# Patient Record
Sex: Female | Born: 1980 | Race: Black or African American | Hispanic: No | Marital: Single | Smoking: Current every day smoker
Health system: Southern US, Community
[De-identification: ages and names within clinical notes are randomized; demographics above are authoritative.]

## PROBLEM LIST (undated history)

## (undated) ENCOUNTER — Emergency Department (HOSPITAL_COMMUNITY): Admission: EM | Source: Home / Self Care

## (undated) ENCOUNTER — Emergency Department (HOSPITAL_COMMUNITY): Admission: EM | Payer: Self-pay | Source: Home / Self Care

## (undated) DIAGNOSIS — D649 Anemia, unspecified: Secondary | ICD-10-CM

## (undated) DIAGNOSIS — N939 Abnormal uterine and vaginal bleeding, unspecified: Secondary | ICD-10-CM

## (undated) DIAGNOSIS — Z789 Other specified health status: Secondary | ICD-10-CM

## (undated) DIAGNOSIS — O24419 Gestational diabetes mellitus in pregnancy, unspecified control: Secondary | ICD-10-CM

## (undated) HISTORY — PX: OTHER SURGICAL HISTORY: SHX169

## (undated) HISTORY — PX: BREAST SURGERY: SHX581

---
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
## 2002-06-11 ENCOUNTER — Emergency Department (HOSPITAL_COMMUNITY): Admission: EM | Admit: 2002-06-11 | Discharge: 2002-06-11 | Payer: Self-pay | Admitting: Emergency Medicine

## 2002-09-18 ENCOUNTER — Emergency Department (HOSPITAL_COMMUNITY): Admission: EM | Admit: 2002-09-18 | Discharge: 2002-09-18 | Payer: Self-pay | Admitting: Emergency Medicine

## 2002-10-20 ENCOUNTER — Emergency Department (HOSPITAL_COMMUNITY): Admission: EM | Admit: 2002-10-20 | Discharge: 2002-10-20 | Payer: Self-pay | Admitting: Emergency Medicine

## 2002-10-26 ENCOUNTER — Emergency Department (HOSPITAL_COMMUNITY): Admission: EM | Admit: 2002-10-26 | Discharge: 2002-10-26 | Payer: Self-pay | Admitting: Emergency Medicine

## 2002-11-20 ENCOUNTER — Emergency Department (HOSPITAL_COMMUNITY): Admission: EM | Admit: 2002-11-20 | Discharge: 2002-11-20 | Payer: Self-pay | Admitting: Emergency Medicine

## 2002-12-18 ENCOUNTER — Emergency Department (HOSPITAL_COMMUNITY): Admission: EM | Admit: 2002-12-18 | Discharge: 2002-12-18 | Payer: Self-pay | Admitting: *Deleted

## 2003-03-22 ENCOUNTER — Inpatient Hospital Stay (HOSPITAL_COMMUNITY): Admission: AD | Admit: 2003-03-22 | Discharge: 2003-03-22 | Payer: Self-pay | Admitting: Obstetrics and Gynecology

## 2003-05-10 ENCOUNTER — Inpatient Hospital Stay (HOSPITAL_COMMUNITY): Admission: AD | Admit: 2003-05-10 | Discharge: 2003-05-10 | Payer: Self-pay | Admitting: Obstetrics

## 2003-05-11 ENCOUNTER — Inpatient Hospital Stay (HOSPITAL_COMMUNITY): Admission: AD | Admit: 2003-05-11 | Discharge: 2003-05-11 | Payer: Self-pay | Admitting: Obstetrics and Gynecology

## 2003-06-29 ENCOUNTER — Inpatient Hospital Stay (HOSPITAL_COMMUNITY): Admission: AD | Admit: 2003-06-29 | Discharge: 2003-06-29 | Payer: Self-pay | Admitting: Obstetrics and Gynecology

## 2003-10-29 ENCOUNTER — Inpatient Hospital Stay (HOSPITAL_COMMUNITY): Admission: AD | Admit: 2003-10-29 | Discharge: 2003-10-29 | Payer: Self-pay | Admitting: Obstetrics and Gynecology

## 2003-12-21 ENCOUNTER — Inpatient Hospital Stay (HOSPITAL_COMMUNITY): Admission: AD | Admit: 2003-12-21 | Discharge: 2003-12-21 | Payer: Self-pay | Admitting: *Deleted

## 2004-01-11 ENCOUNTER — Inpatient Hospital Stay (HOSPITAL_COMMUNITY): Admission: AD | Admit: 2004-01-11 | Discharge: 2004-01-13 | Payer: Self-pay | Admitting: Obstetrics and Gynecology

## 2005-07-02 ENCOUNTER — Encounter (HOSPITAL_BASED_OUTPATIENT_CLINIC_OR_DEPARTMENT_OTHER): Admission: RE | Admit: 2005-07-02 | Discharge: 2005-09-30 | Payer: Self-pay | Admitting: Surgery

## 2005-07-14 ENCOUNTER — Emergency Department (HOSPITAL_COMMUNITY): Admission: EM | Admit: 2005-07-14 | Discharge: 2005-07-14 | Payer: Self-pay | Admitting: Emergency Medicine

## 2005-07-18 ENCOUNTER — Emergency Department (HOSPITAL_COMMUNITY): Admission: EM | Admit: 2005-07-18 | Discharge: 2005-07-18 | Payer: Self-pay | Admitting: Family Medicine

## 2005-07-19 ENCOUNTER — Ambulatory Visit: Payer: Self-pay | Admitting: Gastroenterology

## 2005-07-20 ENCOUNTER — Ambulatory Visit (HOSPITAL_COMMUNITY): Admission: RE | Admit: 2005-07-20 | Discharge: 2005-07-20 | Payer: Self-pay | Admitting: Gastroenterology

## 2005-07-20 ENCOUNTER — Ambulatory Visit: Payer: Self-pay | Admitting: Gastroenterology

## 2005-10-08 ENCOUNTER — Emergency Department (HOSPITAL_COMMUNITY): Admission: EM | Admit: 2005-10-08 | Discharge: 2005-10-08 | Payer: Self-pay | Admitting: Family Medicine

## 2005-10-15 ENCOUNTER — Emergency Department (HOSPITAL_COMMUNITY): Admission: EM | Admit: 2005-10-15 | Discharge: 2005-10-15 | Payer: Self-pay | Admitting: Family Medicine

## 2006-04-15 ENCOUNTER — Emergency Department (HOSPITAL_COMMUNITY): Admission: EM | Admit: 2006-04-15 | Discharge: 2006-04-15 | Payer: Self-pay | Admitting: Family Medicine

## 2007-05-07 ENCOUNTER — Emergency Department (HOSPITAL_COMMUNITY): Admission: EM | Admit: 2007-05-07 | Discharge: 2007-05-07 | Payer: Self-pay | Admitting: *Deleted

## 2007-06-18 ENCOUNTER — Emergency Department (HOSPITAL_COMMUNITY): Admission: EM | Admit: 2007-06-18 | Discharge: 2007-06-18 | Payer: Self-pay | Admitting: Emergency Medicine

## 2007-11-13 ENCOUNTER — Emergency Department (HOSPITAL_COMMUNITY): Admission: EM | Admit: 2007-11-13 | Discharge: 2007-11-13 | Payer: Self-pay | Admitting: Family Medicine

## 2008-01-22 ENCOUNTER — Emergency Department (HOSPITAL_COMMUNITY): Admission: EM | Admit: 2008-01-22 | Discharge: 2008-01-22 | Payer: Self-pay | Admitting: Emergency Medicine

## 2008-02-10 ENCOUNTER — Inpatient Hospital Stay (HOSPITAL_COMMUNITY): Admission: AD | Admit: 2008-02-10 | Discharge: 2008-02-10 | Payer: Self-pay | Admitting: Obstetrics & Gynecology

## 2008-02-18 ENCOUNTER — Inpatient Hospital Stay (HOSPITAL_COMMUNITY): Admission: RE | Admit: 2008-02-18 | Discharge: 2008-02-18 | Payer: Self-pay | Admitting: Obstetrics & Gynecology

## 2008-03-25 ENCOUNTER — Inpatient Hospital Stay (HOSPITAL_COMMUNITY): Admission: AD | Admit: 2008-03-25 | Discharge: 2008-03-25 | Payer: Self-pay | Admitting: Obstetrics & Gynecology

## 2008-07-17 ENCOUNTER — Inpatient Hospital Stay (HOSPITAL_COMMUNITY): Admission: AD | Admit: 2008-07-17 | Discharge: 2008-07-17 | Payer: Self-pay | Admitting: Obstetrics and Gynecology

## 2008-10-05 ENCOUNTER — Inpatient Hospital Stay (HOSPITAL_COMMUNITY): Admission: AD | Admit: 2008-10-05 | Discharge: 2008-10-07 | Payer: Self-pay | Admitting: Obstetrics and Gynecology

## 2008-11-15 ENCOUNTER — Inpatient Hospital Stay (HOSPITAL_COMMUNITY): Admission: AD | Admit: 2008-11-15 | Discharge: 2008-11-15 | Payer: Self-pay | Admitting: Obstetrics and Gynecology

## 2009-01-31 IMAGING — US US OB TRANSVAGINAL
1 series · 14 of 18 positions shown · non-contrast
Comparison: none

OBSTETRICAL ULTRASOUND:
 This ultrasound exam was performed in the [HOSPITAL] Ultrasound Department.  The OB US report was generated in the AS system, and faxed to the ordering physician.  This report is also available in [REDACTED] PACS.

[Series 1: us ob transvaginal · 0.13mm/px · 14 of 18 slices shown]
[im 1/18]
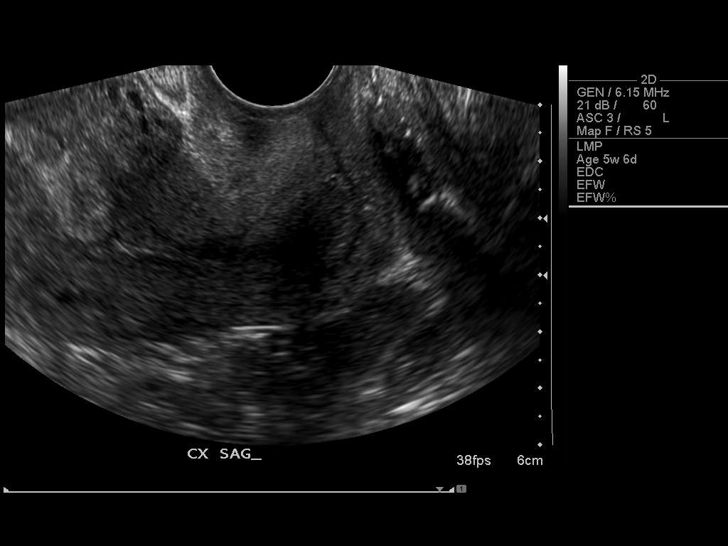
[im 2/18]
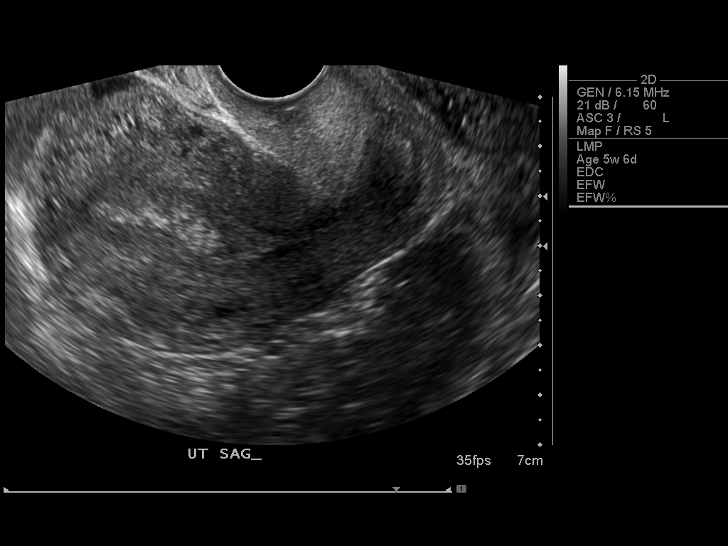
[im 4/18]
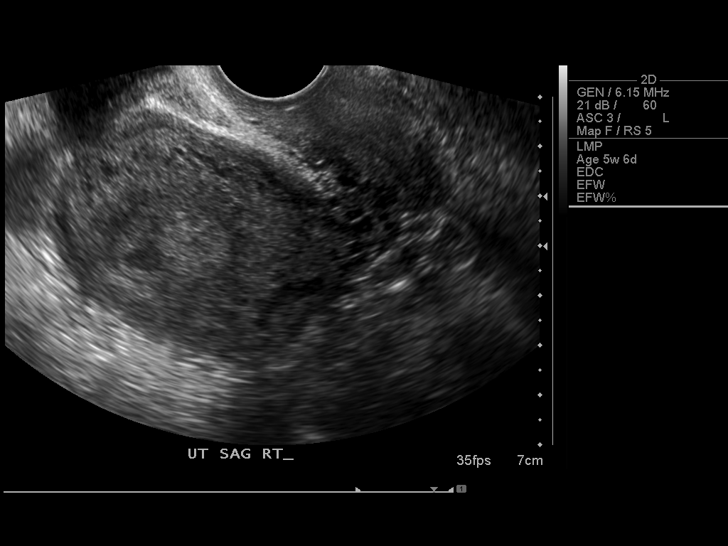
[im 5/18]
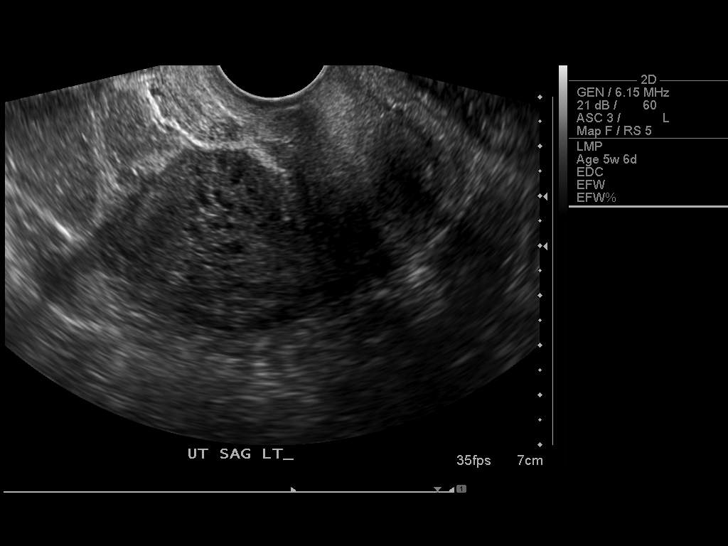
[im 6/18]
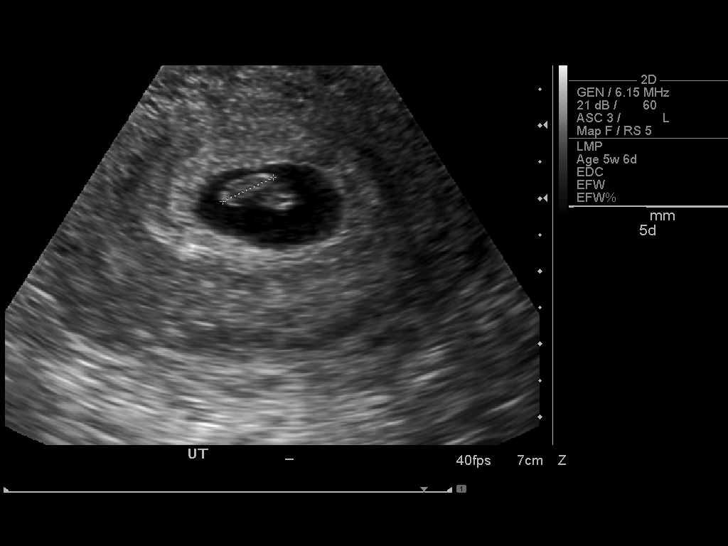
[im 8/18]
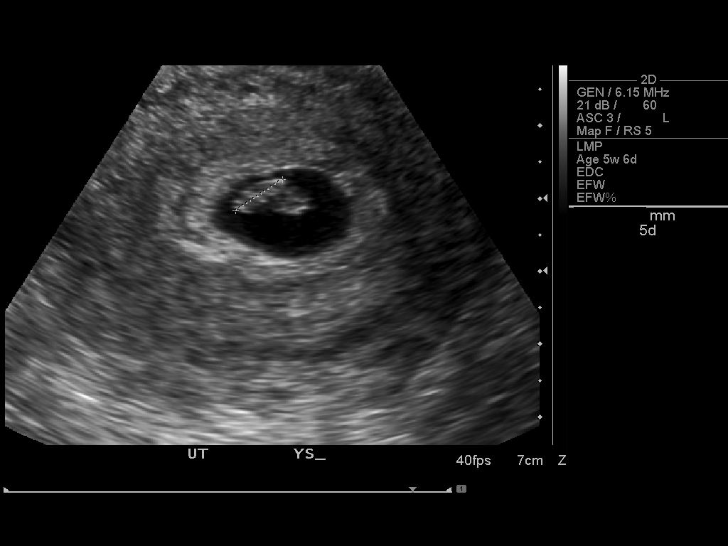
[im 9/18]
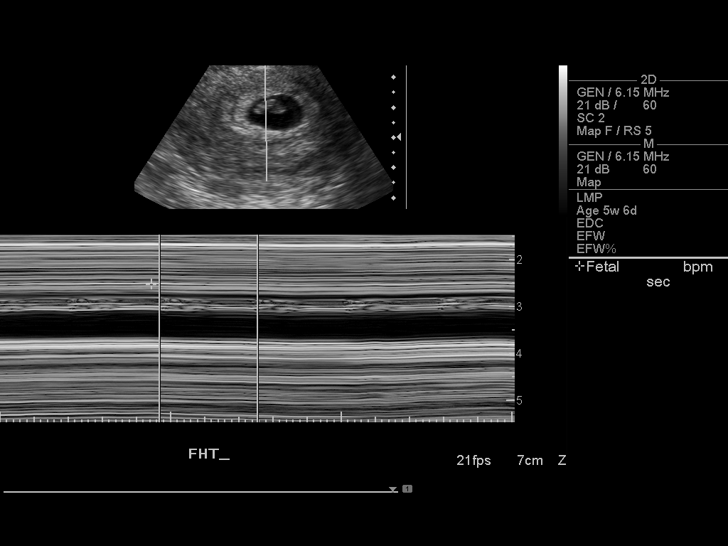
[im 10/18]
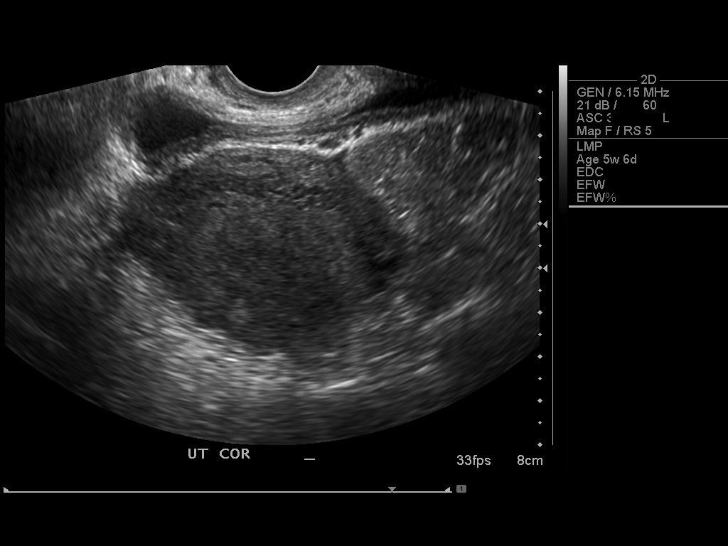
[im 11/18]
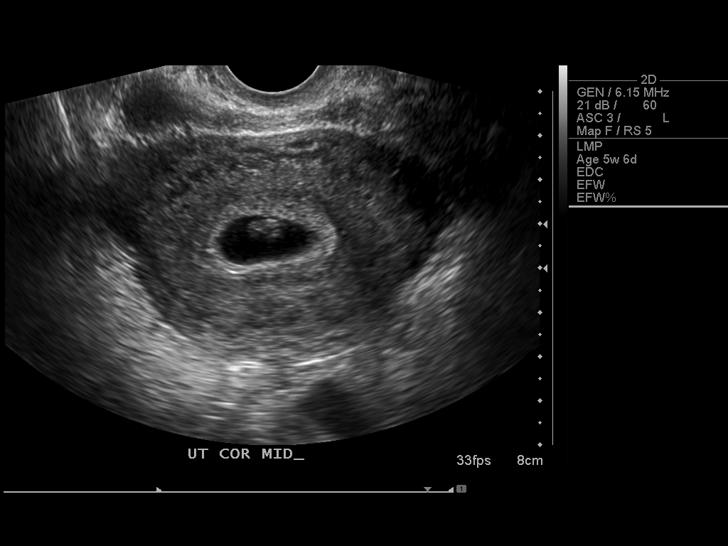
[im 13/18]
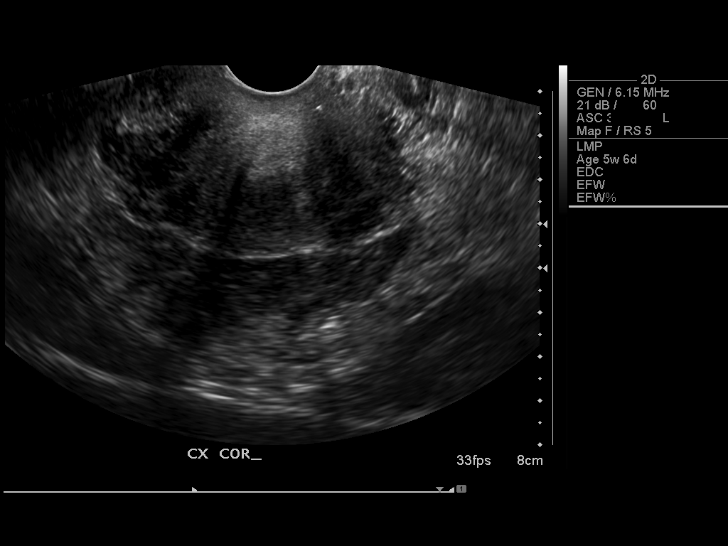
[im 14/18]
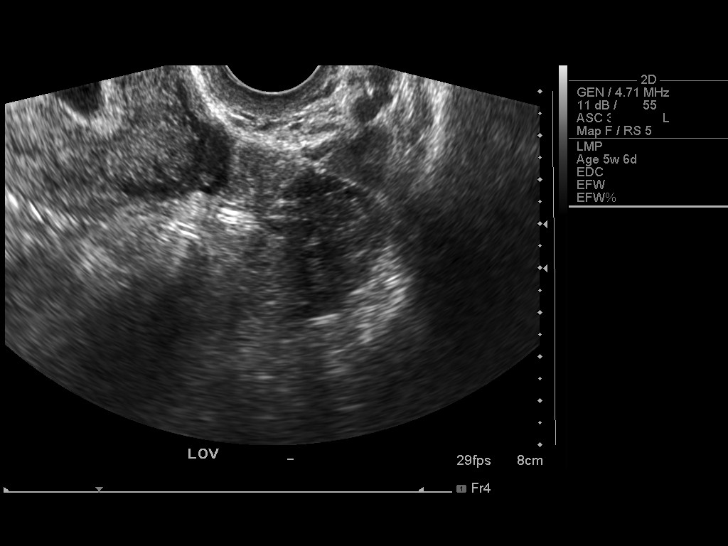
[im 15/18]
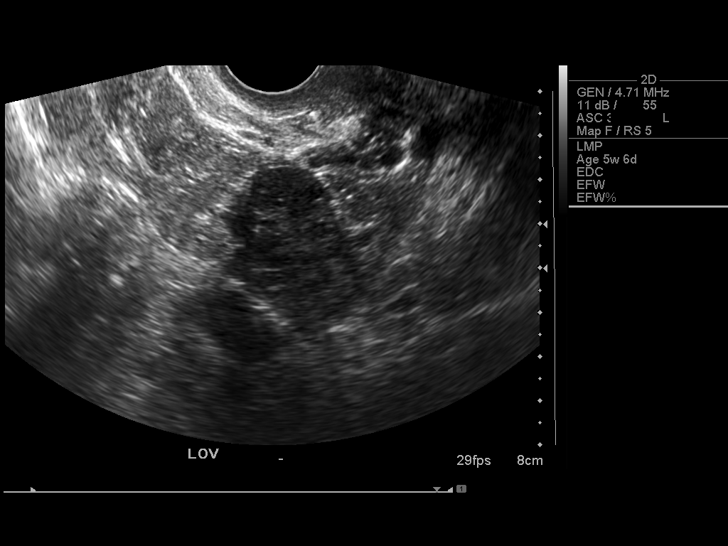
[im 17/18]
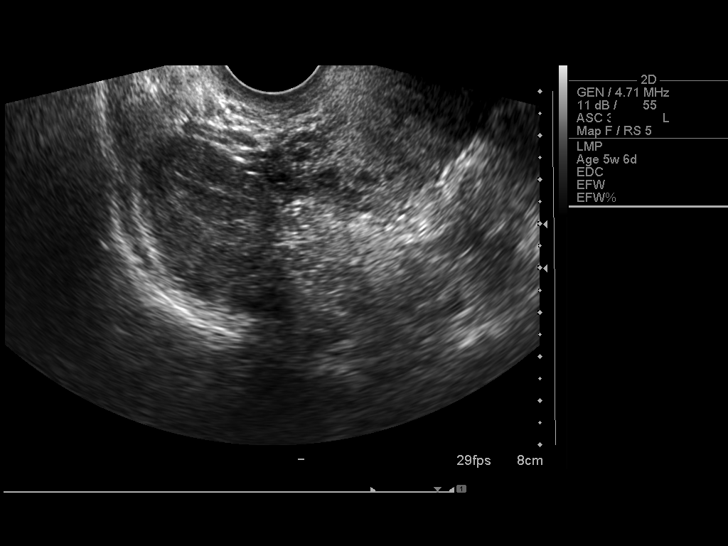
[im 18/18]
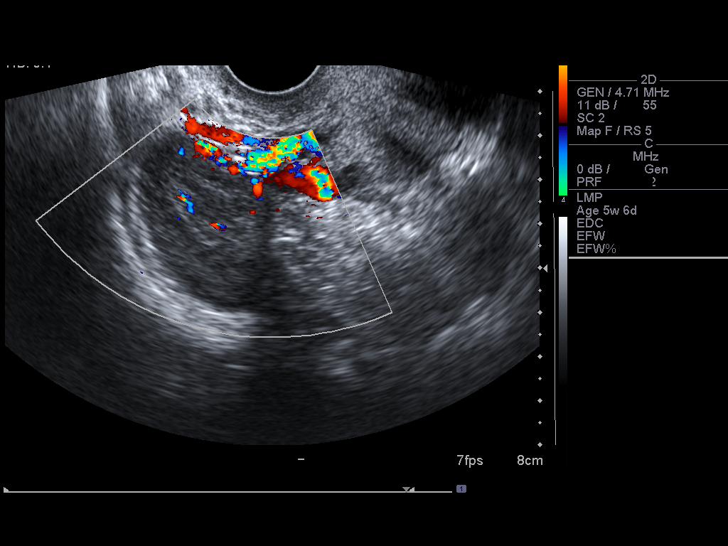

[14 of 18 positions shown; findings below may reference images not displayed]

IMPRESSION: See AS Obstetric US report.

## 2010-03-13 ENCOUNTER — Inpatient Hospital Stay (HOSPITAL_COMMUNITY): Admission: AD | Admit: 2010-03-13 | Discharge: 2010-03-13 | Payer: Self-pay | Admitting: Obstetrics and Gynecology

## 2010-08-09 LAB — URINE MICROSCOPIC-ADD ON

## 2010-08-09 LAB — URINALYSIS, ROUTINE W REFLEX MICROSCOPIC: Glucose, UA: NEGATIVE mg/dL

## 2010-09-04 LAB — RAPID URINE DRUG SCREEN, HOSP PERFORMED
Amphetamines: NOT DETECTED
Cocaine: NOT DETECTED
Tetrahydrocannabinol: POSITIVE — AB

## 2010-09-04 LAB — DIFFERENTIAL
Basophils Absolute: 0 10*3/uL (ref 0.0–0.1)
Lymphocytes Relative: 35 % (ref 12–46)
Monocytes Absolute: 0.5 10*3/uL (ref 0.1–1.0)
Monocytes Relative: 5 % (ref 3–12)
Neutrophils Relative %: 58 % (ref 43–77)

## 2010-09-04 LAB — CBC
HCT: 34.9 % — ABNORMAL LOW (ref 36.0–46.0)
Hemoglobin: 11.8 g/dL — ABNORMAL LOW (ref 12.0–15.0)
Platelets: 272 10*3/uL (ref 150–400)
RBC: 3.71 MIL/uL — ABNORMAL LOW (ref 3.87–5.11)
RDW: 15.1 % (ref 11.5–15.5)
WBC: 9.3 10*3/uL (ref 4.0–10.5)

## 2010-09-04 LAB — URINALYSIS, ROUTINE W REFLEX MICROSCOPIC
Bilirubin Urine: NEGATIVE
Glucose, UA: NEGATIVE mg/dL
Nitrite: NEGATIVE
Specific Gravity, Urine: >1.03 — ABNORMAL HIGH (ref 1.005–1.030)
Urobilinogen, UA: 0.2 mg/dL (ref 0.0–1.0)
pH: 6 (ref 5.0–8.0)

## 2010-09-04 LAB — GC/CHLAMYDIA PROBE AMP, URINE: GC Probe Amp, Urine: NEGATIVE

## 2010-09-05 LAB — RAPID URINE DRUG SCREEN, HOSP PERFORMED
Amphetamines: NOT DETECTED
Cocaine: NOT DETECTED
Opiates: NOT DETECTED

## 2010-09-05 LAB — CBC
HCT: 24.4 % — ABNORMAL LOW (ref 36.0–46.0)
Hemoglobin: 8.3 g/dL — ABNORMAL LOW (ref 12.0–15.0)
MCHC: 34.2 g/dL (ref 30.0–36.0)
MCHC: 34.2 g/dL (ref 30.0–36.0)
Platelets: 227 10*3/uL (ref 150–400)
RDW: 15 % (ref 11.5–15.5)
WBC: 13.6 10*3/uL — ABNORMAL HIGH (ref 4.0–10.5)

## 2010-09-05 LAB — GLUCOSE, CAPILLARY: Glucose-Capillary: 69 mg/dL — ABNORMAL LOW (ref 70–99)

## 2010-09-12 LAB — RAPID URINE DRUG SCREEN, HOSP PERFORMED
Barbiturates: NOT DETECTED
Opiates: NOT DETECTED

## 2010-09-12 LAB — URINE CULTURE

## 2010-09-12 LAB — WET PREP, GENITAL
Trich, Wet Prep: NONE SEEN
Yeast Wet Prep HPF POC: NONE SEEN
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
## 2010-10-13 NOTE — Consult Note (Signed)
NAME:  Casey Duncan, Casey Duncan               ACCOUNT NO.:  [id number]   MEDICAL RECORD NO.:  [id number]           PATIENT TYPE:   LOCATION:                                 FACILITY:   PHYSICIAN:  Theresia Majors. Tanda Rockers, M.D.     DATE OF BIRTH:   DATE OF CONSULTATION:  07/12/2005  DATE OF DISCHARGE:                                   CONSULTATION   REASON FOR THE CONSULTATION:  The patient is a 30 year old female referred  from the [facility] Emergency Room by Dr. Amada Kingfisher,  the ER physician, for management of a left gluteal abscess.   IMPRESSION:  Adequately drained abscess with healthy granulation tissue,  with complete resolution anticipated.   SUBJECTIVE:  Casey Duncan is a 30 year old lady who was initially seen in the  [facility] Emergency Room on June 26, 2005. She had a painful wound on  her right gluteal area. She apparently underwent an incision and drainage in  the emergency room with packing and was started on Keflex. She was  discharged with preferential followup in [place] but chose to be followed  up at the [facility]. Her past medical history is remarkable  for the absence of diabetes. She is a smoker. She is on no medication, and  denies allergies. At the time of this dictation, she has completed the  course of Keflex. She denies interim fevers, drainage, or malodor. She  denies previous surgical procedures. Her family history is remarkable for  hypertension. Socially, she is on public assistance. She does not indicate  employment.   PHYSICAL EXAMINATION:  She is alert, oriented, in no acute distress.  Physical exam restricted to the area and of the wound. The wound was  inspected. It appears to be a healthy proliferative stage of healing. There  is healthy granulation. There is apposition of the skin edges. There is  minimum to scant serous drainage.   DISCUSSION:  We have reassured the patient that this was most likely an area  involving a  hair follicle. There seems to be no active inflammation. The  wound is quite supple, and it is nontender. We have encouraged her with the  prognosis that this wound should be completely healed over in 2-4 weeks. She  should continue her use of antiseptic soap and her normal bathing pattern  with irrigation with tap water.   If there is any delay in the healing as we have anticipated per above, she  should call the wound center for a reevaluation. The patient seems to  understand these instructions and indicates that she will comply. She  expresses gratitude for having been seen in the clinic.           ______________________________  Theresia Majors. Tanda Rockers, M.D.     Cephus Slater  D:  07/12/2005  T:  07/13/2005  Job:  [id number]   cc:   Amada Kingfisher, M.D.  [facility]  Emergency Room Services

## 2011-02-26 LAB — URINALYSIS, ROUTINE W REFLEX MICROSCOPIC
Ketones, ur: 15 — AB
Leukocytes, UA: NEGATIVE
Nitrite: NEGATIVE
Protein, ur: 30 — AB
Urobilinogen, UA: 1

## 2011-02-26 LAB — CBC
Hemoglobin: 12.3
MCHC: 33.4
Platelets: 253
RDW: 14

## 2011-02-26 LAB — WET PREP, GENITAL
Clue Cells Wet Prep HPF POC: NONE SEEN
Trich, Wet Prep: NONE SEEN

## 2011-02-26 LAB — ABO/RH: ABO/RH(D): O POS

## 2011-02-26 LAB — URINE MICROSCOPIC-ADD ON

## 2011-02-26 LAB — RAPID STREP SCREEN (MED CTR MEBANE ONLY): Streptococcus, Group A Screen (Direct): POSITIVE — AB

## 2011-02-26 LAB — GC/CHLAMYDIA PROBE AMP, GENITAL: GC Probe Amp, Genital: NEGATIVE

## 2011-03-05 LAB — URINE MICROSCOPIC-ADD ON

## 2011-03-05 LAB — WET PREP, GENITAL: Yeast Wet Prep HPF POC: NONE SEEN

## 2011-03-05 LAB — URINALYSIS, ROUTINE W REFLEX MICROSCOPIC
Bilirubin Urine: NEGATIVE
Ketones, ur: NEGATIVE
Specific Gravity, Urine: 1.025
Urobilinogen, UA: 1

## 2011-06-23 ENCOUNTER — Inpatient Hospital Stay (HOSPITAL_COMMUNITY)
Admission: AD | Admit: 2011-06-23 | Discharge: 2011-06-23 | Disposition: A | Discharge status: Home / Self Care | Payer: Self-pay | Source: Ambulatory Visit | Attending: Obstetrics & Gynecology | Admitting: Obstetrics & Gynecology

## 2011-06-23 ENCOUNTER — Encounter (HOSPITAL_COMMUNITY): Payer: Self-pay | Admitting: *Deleted

## 2011-06-23 DIAGNOSIS — A599 Trichomoniasis, unspecified: Secondary | ICD-10-CM

## 2011-06-23 DIAGNOSIS — A5901 Trichomonal vulvovaginitis: Secondary | ICD-10-CM | POA: Insufficient documentation

## 2011-06-23 DIAGNOSIS — Z3202 Encounter for pregnancy test, result negative: Secondary | ICD-10-CM | POA: Insufficient documentation

## 2011-06-23 DIAGNOSIS — R109 Unspecified abdominal pain: Secondary | ICD-10-CM | POA: Insufficient documentation

## 2011-06-23 HISTORY — DX: Other specified health status: Z78.9

## 2011-06-23 LAB — URINALYSIS, ROUTINE W REFLEX MICROSCOPIC
Glucose, UA: NEGATIVE mg/dL
pH: 6 (ref 5.0–8.0)

## 2011-06-23 LAB — POCT PREGNANCY, URINE: Preg Test, Ur: NEGATIVE

## 2011-06-23 LAB — URINE MICROSCOPIC-ADD ON

## 2011-06-23 LAB — WET PREP, GENITAL

## 2011-06-23 MED ORDER — METRONIDAZOLE 500 MG PO TABS
2000.0000 mg | ORAL_TABLET | Freq: Once | ORAL | Status: AC
  Administered 2011-06-23: 2000 mg via ORAL
  Filled 2011-06-23: qty 4

## 2011-06-23 MED ORDER — METRONIDAZOLE 500 MG PO TABS: 2000.0000 mg | ORAL_TABLET | Freq: Once | ORAL | Status: AC

## 2011-06-23 NOTE — Progress Notes (Signed)
CNM in to give pt results, asked s.o. And his daughter to step out.  After a few minutes, CNM requested that I join her while giving the results as the pt went to the lobby to get the s.o. To hear the positive trichomonas results, but stated did not want s.o. To know the SPT was negative.  Pt and s.o. Returned from lobby, given results of + trichomonas, all questions answered, discussed where the STD comes from, length of time it was present is unknown, unsure who had the STD first.  Instructed s.o. Needs to be treated at the STD clinic at the [facility].

## 2011-06-23 NOTE — Progress Notes (Signed)
Pt states, " I've had pain in my mid and low abdomen for one week."

## 2011-06-23 NOTE — Progress Notes (Signed)
States UPTs are always negative when she is pregnant, requesting SPT.  C/o low abd cramping today, nausea x1 wk.

## 2011-06-23 NOTE — ED Provider Notes (Signed)
History     Chief Complaint  Patient presents with  . Abdominal Pain  . Nausea   HPI  States UPTs are always negative when she is pregnant, requesting pregnancy test via the blood.  Also reports low abd cramping today and nausea x1 wk.  No reports of abnormal vaginal discharge or odor.  Desires results when boyfriend is not present in the room.     Past Medical History  Diagnosis Date  . No pertinent past medical history     Past Surgical History  Procedure Date  . No past surgeries     History reviewed. No pertinent family history.  History  Substance Use Topics  . Smoking status: Current Everyday Smoker -- 1.0 packs/day  . Smokeless tobacco: Not on file  . Alcohol Use: No    Allergies: No Known Allergies  No prescriptions prior to admission    Review of Systems  Gastrointestinal: Positive for nausea and abdominal pain.  All other systems reviewed and are negative.   Physical Exam   Blood pressure 106/69, pulse 80, temperature 98.3 F (36.8 C), temperature source Oral, resp. rate 18, height 5' 8.5" (1.74 m), weight 78.019 kg (172 lb), last menstrual period 06/02/2011, SpO2 99.00%.  Physical Exam  Constitutional: She is oriented to person, place, and time. She appears well-developed and well-nourished. No distress.  HENT:  Head: Normocephalic.  Neck: Normal range of motion. Neck supple.  Cardiovascular: Normal rate, regular rhythm and normal heart sounds.  Exam reveals no gallop and no friction rub.   No murmur heard. Respiratory: Effort normal and breath sounds normal. No respiratory distress.  GI: She exhibits no mass. There is no tenderness. There is no rebound, no guarding and no CVA tenderness.  Genitourinary: Cervix exhibits no motion tenderness and no discharge. Vaginal discharge (white, creamy) found.  Musculoskeletal: Normal range of motion.  Neurological: She is alert and oriented to person, place, and time.  Skin: Skin is warm and dry.    Psychiatric: She has a normal mood and affect.    MAU Course  Procedures  Results for orders placed during the hospital encounter of 06/23/11 (from the past 24 hour(s))  URINALYSIS, ROUTINE W REFLEX MICROSCOPIC     Status: Abnormal   Collection Time   06/23/11  8:55 PM      Component Value Range   Color, Urine YELLOW  YELLOW    APPearance CLEAR  CLEAR    Specific Gravity, Urine >1.030 (*) 1.005 - 1.030    pH 6.0  5.0 - 8.0    Glucose, UA NEGATIVE  NEGATIVE (mg/dL)   Hgb urine dipstick SMALL (*) NEGATIVE    Bilirubin Urine NEGATIVE  NEGATIVE    Ketones, ur NEGATIVE  NEGATIVE (mg/dL)   Protein, ur NEGATIVE  NEGATIVE (mg/dL)   Urobilinogen, UA 0.2  0.0 - 1.0 (mg/dL)   Nitrite NEGATIVE  NEGATIVE    Leukocytes, UA TRACE (*) NEGATIVE   URINE MICROSCOPIC-ADD ON     Status: Abnormal   Collection Time   06/23/11  8:55 PM      Component Value Range   Squamous Epithelial / LPF FEW (*) RARE    WBC, UA 3-6  <3 (WBC/hpf)   RBC / HPF 3-6  <3 (RBC/hpf)   Urine-Other FEW TRICHOMONAS NOTED    POCT PREGNANCY, URINE     Status: Normal   Collection Time   06/23/11  9:24 PM      Component Value Range   Preg Test, Ur NEGATIVE  NEGATIVE   HCG, QUANTITATIVE, PREGNANCY     Status: Normal   Collection Time   06/23/11  9:59 PM      Component Value Range   hCG, Beta Chain, Quant, S <1  <5 (mIU/mL)  WET PREP, GENITAL     Status: Abnormal   Collection Time   06/23/11 10:30 PM      Component Value Range   Yeast, Wet Prep NONE SEEN  NONE SEEN    Trich, Wet Prep NONE SEEN  NONE SEEN    Clue Cells, Wet Prep FEW (*) NONE SEEN    WBC, Wet Prep HPF POC FEW (*) NONE SEEN    2 GM Flagyl PO x 1  Assessment and Plan  Trichomoniasis  Plan: Advised partner treatment   [facility] 06/23/2011, 10:51 PM

## 2011-06-24 NOTE — ED Provider Notes (Signed)
Attestation of Attending Supervision of Advanced Practitioner: Evaluation and management procedures were performed by the PA/NP/CNM/OB Fellow under my supervision/collaboration. Chart reviewed, and agree with management and plan.  Jaynie Collins, M.D. 06/24/2011 7:33 AM

## 2011-11-27 ENCOUNTER — Encounter (HOSPITAL_COMMUNITY): Payer: Self-pay | Admitting: Emergency Medicine

## 2011-11-27 ENCOUNTER — Emergency Department (HOSPITAL_COMMUNITY)
Admission: EM | Admit: 2011-11-27 | Discharge: 2011-11-27 | Disposition: A | Discharge status: Home / Self Care | Payer: Self-pay | Attending: Emergency Medicine | Admitting: Emergency Medicine

## 2011-11-27 ENCOUNTER — Emergency Department (HOSPITAL_COMMUNITY): Payer: Self-pay

## 2011-11-27 DIAGNOSIS — IMO0002 Reserved for concepts with insufficient information to code with codable children: Secondary | ICD-10-CM | POA: Insufficient documentation

## 2011-11-27 DIAGNOSIS — S93419A Sprain of calcaneofibular ligament of unspecified ankle, initial encounter: Secondary | ICD-10-CM

## 2011-11-27 DIAGNOSIS — M7989 Other specified soft tissue disorders: Secondary | ICD-10-CM | POA: Insufficient documentation

## 2011-11-27 MED ORDER — OXYCODONE-ACETAMINOPHEN 5-325 MG PO TABS
1.0000 | ORAL_TABLET | Freq: Once | ORAL | Status: AC
  Administered 2011-11-27: 1 via ORAL
  Filled 2011-11-27: qty 1

## 2011-11-27 MED ORDER — KETOROLAC TROMETHAMINE 60 MG/2ML IM SOLN
60.0000 mg | Freq: Once | INTRAMUSCULAR | Status: AC
  Administered 2011-11-27: 60 mg via INTRAMUSCULAR
  Filled 2011-11-27: qty 2

## 2011-11-27 MED ORDER — TRAMADOL HCL 50 MG PO TABS: 50.0000 mg | ORAL_TABLET | Freq: Four times a day (QID) | ORAL | Status: AC | PRN

## 2011-11-27 NOTE — ED Provider Notes (Signed)
History     CSN: [id number]  Arrival date & time 11/27/11  4696   First MD Initiated Contact with Patient 11/27/11 2045      Chief Complaint  Patient presents with  . Foot Injury    (Consider location/radiation/quality/duration/timing/severity/associated sxs/prior treatment) Patient is a 31 y.o. female presenting with foot injury. The history is provided by the patient and a relative.  Foot Injury  The incident occurred 1 to 2 hours ago. The incident occurred at home. The injury mechanism was a direct blow. The pain is present in the left heel. The quality of the pain is described as sharp. The pain is at a severity of 8/10. Pertinent negatives include no numbness. She has tried nothing for the symptoms.   31 y/o female iNAD c/o pain to left heel after hitting it against a wooden piece of furnitiure several hours ago. Pian is exacerbated by weight bearing. Denies numbness or parasthesia   Past Medical History  Diagnosis Date  . No pertinent past medical history     Past Surgical History  Procedure Date  . No past surgeries     History reviewed. No pertinent family history.  History  Substance Use Topics  . Smoking status: Current Everyday Smoker -- 1.0 packs/day  . Smokeless tobacco: Not on file  . Alcohol Use: No    OB History    Grav Para Term Preterm Abortions TAB SAB Ect Mult Living   2 2 2       2       Review of Systems  Musculoskeletal: Positive for arthralgias.  Neurological: Negative for numbness.  All other systems reviewed and are negative.    Allergies  Review of patient's allergies indicates no known allergies.  Home Medications  No current outpatient prescriptions on file.  BP 111/76  Pulse 79  Temp 98.6 F (37 C) (Oral)  Resp 16  LMP 11/18/2011  Physical Exam  Vitals reviewed. Constitutional: She is oriented to person, place, and time. She appears well-developed and well-nourished. No distress.  HENT:  Head: Normocephalic.  Eyes:  Conjunctivae and EOM are normal.  Cardiovascular: Normal rate.   Pulmonary/Chest: Effort normal.  Musculoskeletal: Normal range of motion.       Mild swelling to right lateral inferior malleolus. Diffuse tenderness to right heel and ankle  Neurological: She is alert and oriented to person, place, and time.  Psychiatric: She has a normal mood and affect.    ED Course  Procedures (including critical care time)  Labs Reviewed - No data to display Dg Foot Complete Right  11/27/2011  *RADIOLOGY REPORT*  Clinical Data: Leary Roca another person.  Foot injury.  Pain in the heel.  RIGHT FOOT COMPLETE - 3+ VIEW  Comparison: 05/07/2007  Findings: Visualized osseous structures have a normal appearance.  IMPRESSION: Negative exam.  Original Report Authenticated By: Patterson Hammersmith, M.D.     1. Calcaneofibular (ligament) ankle sprain       MDM  Pt with right heel pain  following mild blunt trauma, XR shows no fracture.         Wynetta Emery, PA-C 11/27/11 2209

## 2011-11-27 NOTE — ED Notes (Signed)
Patient reports that she kicked a wooden wardrobe, and hurt her right heal. The patient is able to put minimal weight on her foot

## 2011-12-05 NOTE — ED Provider Notes (Signed)
Medical screening examination/treatment/procedure(s) were performed by non-physician practitioner and as supervising physician I was immediately available for consultation/collaboration.  Whittany Parish, MD 12/05/11 0849 

## 2011-12-19 NOTE — ED Notes (Signed)
Pt is c/o lower back pain that has progressively gotten worse  Pt states pain started 3 days ago  Pt is ambulatory but states certain positions make it hurt worse  Pt rates pain 7/10  Pt states pain started shortly after she woke up  Denies injury

## 2011-12-20 ENCOUNTER — Encounter (HOSPITAL_COMMUNITY): Payer: Self-pay | Admitting: *Deleted

## 2011-12-20 ENCOUNTER — Emergency Department (HOSPITAL_COMMUNITY)
Admission: EM | Admit: 2011-12-20 | Discharge: 2011-12-20 | Disposition: A | Discharge status: Home / Self Care | Payer: Self-pay | Attending: Emergency Medicine | Admitting: Emergency Medicine

## 2011-12-20 DIAGNOSIS — M549 Dorsalgia, unspecified: Secondary | ICD-10-CM | POA: Insufficient documentation

## 2011-12-20 DIAGNOSIS — F17200 Nicotine dependence, unspecified, uncomplicated: Secondary | ICD-10-CM | POA: Insufficient documentation

## 2011-12-20 LAB — PREGNANCY, URINE: Preg Test, Ur: NEGATIVE

## 2011-12-20 LAB — URINALYSIS, ROUTINE W REFLEX MICROSCOPIC
Glucose, UA: NEGATIVE mg/dL
Protein, ur: NEGATIVE mg/dL
Specific Gravity, Urine: 1.017 (ref 1.005–1.030)

## 2011-12-20 LAB — URINE MICROSCOPIC-ADD ON

## 2011-12-20 MED ORDER — METHOCARBAMOL 500 MG PO TABS: 1000.0000 mg | ORAL_TABLET | Freq: Four times a day (QID) | ORAL | Status: DC

## 2011-12-20 MED ORDER — METHOCARBAMOL 500 MG PO TABS: 1000.0000 mg | ORAL_TABLET | Freq: Four times a day (QID) | ORAL | Status: AC

## 2011-12-20 MED ORDER — CYCLOBENZAPRINE HCL 10 MG PO TABS
10.0000 mg | ORAL_TABLET | Freq: Once | ORAL | Status: AC
  Administered 2011-12-20: 10 mg via ORAL
  Filled 2011-12-20: qty 1

## 2011-12-20 MED ORDER — IBUPROFEN 800 MG PO TABS: 800.0000 mg | ORAL_TABLET | Freq: Three times a day (TID) | ORAL | Status: DC | PRN

## 2011-12-20 MED ORDER — HYDROCODONE-ACETAMINOPHEN 5-325 MG PO TABS
1.0000 | ORAL_TABLET | Freq: Once | ORAL | Status: AC
  Administered 2011-12-20: 1 via ORAL
  Filled 2011-12-20: qty 1

## 2011-12-20 MED ORDER — IBUPROFEN 800 MG PO TABS: 800.0000 mg | ORAL_TABLET | Freq: Three times a day (TID) | ORAL | Status: AC | PRN

## 2011-12-20 MED ORDER — TRAMADOL HCL 50 MG PO TABS: 50.0000 mg | ORAL_TABLET | Freq: Four times a day (QID) | ORAL | Status: DC | PRN

## 2011-12-20 MED ORDER — HYDROCODONE-ACETAMINOPHEN 5-325 MG PO TABS: ORAL_TABLET | ORAL | Status: AC

## 2011-12-20 NOTE — ED Provider Notes (Signed)
History     CSN: [id number]  Arrival date & time 12/20/11  0904   First MD Initiated Contact with Casey Duncan 12/20/11 [phone]      Chief Complaint  Casey Duncan presents with  . Back Pain    (Consider location/radiation/quality/duration/timing/severity/associated sxs/prior treatment) HPI Comments: Casey Duncan is a 31 year old African American female who presents today with a chief complaint of low back pain x 7 days. Casey Duncan woke up Thursday morning with back pain. She denies known mechanism of injury, but states that she had been going to the gym and lifting weights prior to onset of the pain. She describes the pain as stabbing, located across her lower back and radiating into both hips. It is slightly worse on the right side. It is aggravated by any form of movement and alleviated by lying still. She has taken ibuprofen, aleve, and applied heat to the area with no relief. The pain has been constant and has progressively worsened since last Thursday. The pain keeps her awake at night. She denies pain in her legs but feels weak due to back pain. She denies bowel or bladder dysfunction, fever, chills, abdominal pain, neck pain or stiffness, headache, lightheadedness, numbness or tingling in her back or extremities, IV drug use, change in appetite, or unexplained weight loss. She reports a sedentary lifestyle.  Casey Duncan is a 31 y.o. female presenting with back pain. The history is provided by the Casey Duncan and the spouse.  Back Pain  This is a new problem. The current episode started more than 1 week ago. The problem occurs constantly. The pain is associated with no known injury. The pain is present in the lumbar spine. The symptoms are aggravated by bending and twisting. Pertinent negatives include no fever, no numbness, no weight loss, no headaches, no abdominal pain, no bowel incontinence, no perianal numbness, no bladder incontinence, no dysuria, no pelvic pain, no tingling and no weakness. She has tried  NSAIDs and heat for the symptoms. The treatment provided no relief. Risk factors include a sedentary lifestyle.    Past Medical History  Diagnosis Date  . No pertinent past medical history     Past Surgical History  Procedure Date  . No past surgeries     No family history on file.  History  Substance Use Topics  . Smoking status: Current Everyday Smoker -- 1.0 packs/day  . Smokeless tobacco: Not on file  . Alcohol Use: No    OB History    Grav Para Term Preterm Abortions TAB SAB Ect Mult Living   2 2 2       2       Review of Systems  Constitutional: Negative for fever, chills, weight loss, appetite change and unexpected weight change.  HENT: Negative for neck pain and neck stiffness.   Gastrointestinal: Negative for nausea, vomiting, abdominal pain, diarrhea, constipation and bowel incontinence.       Negative for fecal incontinence.   Genitourinary: Negative for bladder incontinence, dysuria, urgency, frequency, hematuria, flank pain, vaginal bleeding, vaginal discharge, difficulty urinating and pelvic pain.       Negative for urinary incontinence or retention.  Musculoskeletal: Positive for back pain. Negative for myalgias and joint swelling.  Neurological: Negative for dizziness, tingling, weakness, light-headedness, numbness and headaches.       Denies saddle paresthesias.    Allergies  Review of Casey Duncan's allergies indicates no known allergies.  Home Medications   Current Outpatient Rx  Name Route Sig Dispense Refill  . IBUPROFEN  200 MG PO TABS Oral Take 400 mg by mouth every 6 (six) hours as needed. For pain    . ICY HOT EXTRA STRENGTH 10-30 % EX CREA Apply externally Apply 1 application topically as needed. For pain on lower back    . NAPROXEN SODIUM 220 MG PO TABS Oral Take 440 mg by mouth 2 (two) times daily with a meal.      BP 100/63  Pulse 84  Temp 97.7 F (36.5 C) (Oral)  Resp 16  SpO2 100%  LMP 12/13/2011  Physical Exam  Nursing note and  vitals reviewed. Constitutional: She appears well-developed and well-nourished.  HENT:  Head: Normocephalic and atraumatic.  Right Ear: External ear normal.  Left Ear: External ear normal.  Eyes: Conjunctivae and EOM are normal. Pupils are equal, round, and reactive to light.  Neck: Normal range of motion. Neck supple.  Cardiovascular: Normal rate, regular rhythm, normal heart sounds and intact distal pulses.   Pulmonary/Chest: Effort normal and breath sounds normal. She has no wheezes. She has no rales.  Abdominal: Soft. Bowel sounds are normal. She exhibits no distension. There is no tenderness. There is no rebound, no guarding and no CVA tenderness.  Musculoskeletal: Normal range of motion.       Lumbar back: She exhibits tenderness and pain.       Back:       No step-off noted with palpation of spine.   Neurological: She is alert. She has normal strength and normal reflexes. No cranial nerve deficit or sensory deficit. Coordination normal.       5/5 strength in entire lower extremities bilaterally. No sensation deficit.   Skin: Skin is warm and dry. No rash noted.  Psychiatric: She has a normal mood and affect.    ED Course  Procedures (including critical care time)  Labs Reviewed  URINALYSIS, ROUTINE W REFLEX MICROSCOPIC - Abnormal; Notable for the following:    Leukocytes, UA TRACE (*)     All other components within normal limits  URINE MICROSCOPIC-ADD ON - Abnormal; Notable for the following:    Squamous Epithelial / LPF FEW (*)     All other components within normal limits  PREGNANCY, URINE   No results found.   1. Back pain     10:44 AM Casey Duncan seen and examined. Work-up initiated. Medications ordered.   Vital signs reviewed and are as follows: Filed Vitals:   12/20/11 1011  BP: 95/64  Pulse: 80  Temp: 97.6 F (36.4 C)  Resp:    Casey Duncan informed of UA results.   No red flag s/s of low back pain. Casey Duncan was counseled on back pain precautions and told to  do activity as tolerated but do not lift, push, or pull heavy objects more than 10 pounds for the next week.  Casey Duncan counseled to use ice or heat on back for no longer than 15 minutes every hour.   Casey Duncan prescribed muscle relaxer and counseled on proper use of muscle relaxant medication.    Casey Duncan prescribed narcotic pain medicine and counseled on proper use of narcotic pain medications. Counseled not to combine this medication with others containing tylenol.   Urged Casey Duncan not to drink alcohol, drive, or perform any other activities that requires focus while taking either of these medications.  Casey Duncan urged to follow-up with PCP if pain does not improve with treatment and rest or if pain becomes recurrent. Urged to return with worsening severe pain, loss of bowel or bladder control, trouble walking.  The Casey Duncan verbalizes understanding and agrees with the plan.   MDM  Casey Duncan with back pain. UA neg. No neurological deficits. Casey Duncan is ambulatory. No warning symptoms of back pain including: loss of bowel or bladder control, night sweats, waking from sleep with back pain, unexplained fevers or weight loss, h/o cancer, IVDU, recent trauma. No concern for cauda equina, epidural abscess, or other serious cause of back pain. Conservative measures such as rest, ice/heat and pain medicine indicated with PCP follow-up if no improvement with conservative management.          Renne Crigler, [place] 12/20/11 1153

## 2011-12-20 NOTE — ED Notes (Signed)
Pt reports lower back pain since last Thursday. Denies injury.

## 2011-12-21 NOTE — ED Provider Notes (Signed)
Medical screening examination/treatment/procedure(s) were performed by non-physician practitioner and as supervising physician I was immediately available for consultation/collaboration.  Raeford Razor, MD 12/21/11 [phone]

## 2012-01-06 ENCOUNTER — Emergency Department (HOSPITAL_COMMUNITY)
Admission: EM | Admit: 2012-01-06 | Discharge: 2012-01-06 | Disposition: A | Discharge status: Home / Self Care | Payer: Self-pay | Attending: Emergency Medicine | Admitting: Emergency Medicine

## 2012-01-06 ENCOUNTER — Encounter (HOSPITAL_COMMUNITY): Payer: Self-pay | Admitting: Emergency Medicine

## 2012-01-06 DIAGNOSIS — H00019 Hordeolum externum unspecified eye, unspecified eyelid: Secondary | ICD-10-CM | POA: Insufficient documentation

## 2012-01-06 DIAGNOSIS — F17200 Nicotine dependence, unspecified, uncomplicated: Secondary | ICD-10-CM | POA: Insufficient documentation

## 2012-01-06 MED ORDER — ERYTHROMYCIN 5 MG/GM OP OINT: TOPICAL_OINTMENT | OPHTHALMIC | Status: AC

## 2012-01-06 NOTE — ED Notes (Signed)
Pt states that a couple of weeks ago, she noticed a stye on her eye.  Pt states that she has been using warm/cold compresses and aleve.  Compresses helped but it came right back.

## 2012-01-06 NOTE — ED Notes (Signed)
Pt c/o swollen area to R eyelid, pt states noticed area 1 month ago, then came back about 2 days ago, pain with looking up. Pt unable to do visual acuity stating she does not have her glasses and can not see with out them. Pt is also c/o blurred vision.

## 2012-01-06 NOTE — ED Notes (Signed)
Pt refused to listen to discharge instructions, refused to look at writer, pt continually stating how she will be [employer] tomorrow. Writer ensured pt had directors name and number. Pt refused to rate pain, pt refused to sign discharge papers until writer left the room and she was encouraged by female friend at bedside to do so. Pt given d/c instructions and prescription. Pt escorted to check out window.

## 2012-01-08 NOTE — ED Provider Notes (Signed)
History     CSN: [id number]  Arrival date & time 01/06/12  4098   First MD Initiated Contact with Patient 01/06/12 2044      Chief Complaint  Patient presents with  . Eye Pain    right eye  . Blurred Vision    right eye    (Consider location/radiation/quality/duration/timing/severity/associated sxs/prior treatment) The history is provided by the patient.  Casey Duncan is a 31 y.o. female who presents with a swollen area to her R eyelid. She has been applying warm compresses to the area and states that the area temporarily improved with this but then returned. She states that she was applying some type of over the counter "lash lengthening" product to her lashes so is unsure if this may have caused this. She denies any blurred vision at the present time. She has slight pain to the area with looking up. Denies any redness of the eye, itchiness, or FB sensation.  Past Medical History  Diagnosis Date  . No pertinent past medical history     Past Surgical History  Procedure Date  . No past surgeries     History reviewed. No pertinent family history.  History  Substance Use Topics  . Smoking status: Current Everyday Smoker -- 1.0 packs/day  . Smokeless tobacco: Not on file  . Alcohol Use: No    OB History    Grav Para Term Preterm Abortions TAB SAB Ect Mult Living   2 2 2       2       Review of Systems  Constitutional: Negative for fever and chills.  HENT: Negative for congestion, sore throat, rhinorrhea and trouble swallowing.   Eyes: Positive for pain. Negative for photophobia, discharge, redness, itching and visual disturbance.  Neurological: Negative for dizziness and headaches.    Allergies  Review of patient's allergies indicates no known allergies.  Home Medications   Current Outpatient Rx  Name Route Sig Dispense Refill  . NAPROXEN SODIUM 220 MG PO TABS Oral Take 440 mg by mouth 2 (two) times daily with a meal.    . ERYTHROMYCIN 5 MG/GM OP OINT  Place  a 1/2 inch ribbon of ointment into the lower eyelid. Use for 1 week. 1 g 0    BP 114/73  Pulse 73  Temp 98.2 F (36.8 C) (Oral)  SpO2 100%  LMP 01/06/2012  Physical Exam  Nursing note and vitals reviewed. Constitutional: She appears well-developed and well-nourished. No distress.  HENT:  Head: Normocephalic and atraumatic.  Eyes: Conjunctivae and EOM are normal. Pupils are equal, round, and reactive to light.  Fundoscopic exam:      The right eye shows no AV nicking, no exudate and no hemorrhage.       The left eye shows no AV nicking, no exudate and no hemorrhage.       R eyelid: hordeolum to medial upper lid, ttp, conjunctivae normal appearing, no drainage noted Pt was not cooperative with RN visual screening  Neck: Normal range of motion.  Cardiovascular: Normal rate.   Pulmonary/Chest: Effort normal.  Musculoskeletal: Normal range of motion.  Neurological: She is alert.  Skin: Skin is warm and dry. She is not diaphoretic.  Psychiatric: She has a normal mood and affect.    ED Course  Procedures (including critical care time)  Labs Reviewed - No data to display No results found.   1. Hordeolum       MDM  Patient with what appears consistent with hordeolum to  right upper lid. Area is mildly tender to palpation. Remainder of the exam is unremarkable. Patient was unwilling to cooperate with visual acuity. Prescription given for erythromycin. Instructed to followup with ophthalmology should this persist. Reasons to return to the emergency department discussed.        Grant Fontana, PA-C 01/08/12 1415

## 2012-01-09 NOTE — ED Provider Notes (Signed)
Medical screening examination/treatment/procedure(s) were performed by non-physician practitioner and as supervising physician I was immediately available for consultation/collaboration. Jaedan Schuman, MD, FACEP   Tayshun Gappa L Manas Hickling, MD 01/09/12 1508 

## 2012-03-26 ENCOUNTER — Encounter (HOSPITAL_COMMUNITY): Payer: Self-pay | Admitting: *Deleted

## 2012-03-26 ENCOUNTER — Inpatient Hospital Stay (HOSPITAL_COMMUNITY)
Admission: AD | Admit: 2012-03-26 | Discharge: 2012-03-26 | Disposition: A | Discharge status: Home / Self Care | Payer: Self-pay | Source: Ambulatory Visit | Attending: Obstetrics & Gynecology | Admitting: Obstetrics & Gynecology

## 2012-03-26 DIAGNOSIS — N912 Amenorrhea, unspecified: Secondary | ICD-10-CM | POA: Insufficient documentation

## 2012-03-26 DIAGNOSIS — N926 Irregular menstruation, unspecified: Secondary | ICD-10-CM

## 2012-03-26 DIAGNOSIS — Z3202 Encounter for pregnancy test, result negative: Secondary | ICD-10-CM | POA: Insufficient documentation

## 2012-03-26 NOTE — MAU Note (Signed)
Patient states in triage that she wants a pregnancy test. Patient became very angry in triage with this RN when plan of care was explained and we would do a urine pregnancy test which she states she has already done and have been negative. Patient verbalizes that she is having no problems with abdominal pain or bleeding but insists she know if she is pregnancy and wants a blood test. Explained with a negative urine and no problems usual plan of care is to repeat pregnancy test in 2 weeks. Patient extremely verbal at RN. Patient returned to the lobby.

## 2012-03-26 NOTE — MAU Provider Note (Signed)
  History     CSN: [id number]  Arrival date and time: 03/26/12 1230   First Provider Initiated Contact with Patient 03/26/12 1333      Chief Complaint  Patient presents with  . Possible Pregnancy   HPI Casey Duncan is 31 y.o. [id number] Unknown weeks presenting with concerned about missed period.   LMP 02/25/12 has menses q27d that last for 6 days.  Denies cramping.  Is asking for serum test because urine pregnancy was negative with previous pregnancy.  Saw Dr. Gaynell Face at that time for spotting with intercourse and that she had missed period.  Was told she had miscarried, given med for complete.  Then found out she was pregnancy.  This baby died at 39 months of age.  Doesn't feel urine pregnancy test are accurate for her. Denies vaginal bleeding, discharge or abdominal pain.      Past Medical History  Diagnosis Date  . No pertinent past medical history     Past Surgical History  Procedure Date  . No past surgeries     History reviewed. No pertinent family history.  History  Substance Use Topics  . Smoking status: Current Every Day Smoker -- 1.0 packs/day  . Smokeless tobacco: Not on file  . Alcohol Use: No    Allergies: No Known Allergies  Prescriptions prior to admission  Medication Sig Dispense Refill  . naproxen sodium (ANAPROX) 220 MG tablet Take 440 mg by mouth 2 (two) times daily with a meal.        Review of Systems  Constitutional: Negative.   Respiratory: Negative.   Cardiovascular: Negative.   Genitourinary:       Missed period   Physical Exam   Blood pressure 114/74, pulse 85, temperature 98.6 F (37 C), temperature source Oral, resp. rate 16, height 5\' 9"  (1.753 m), weight 79.289 kg (174 lb 12.8 oz), last menstrual period 02/25/2012, SpO2 100.00%.  Physical Exam  Constitutional: She is oriented to person, place, and time. She appears well-developed and well-nourished. No distress.  HENT:  Head: Normocephalic.  Neurological: She is alert and  oriented to person, place, and time.  Psychiatric: She has a normal mood and affect. Her behavior is normal.   Results for orders placed during the hospital encounter of 03/26/12 (from the past 24 hour(s))  POCT PREGNANCY, URINE     Status: Normal   Collection Time   03/26/12 12:54 PM      Component Value Range   Preg Test, Ur NEGATIVE  NEGATIVE  HCG, SERUM, QUALITATIVE     Status: Normal   Collection Time   03/26/12  1:50 PM      Component Value Range   Preg, Serum NEGATIVE  NEGATIVE   MAU Course  Procedures  MDM Explained lab result to patient.  Encouraged her to follow up with doctor of her choice if she does not have period in 1 month  Assessment and Plan  A:  Missed period     Neg serum pregnancy test  P:  Follow up with doctor of her choice  KEY,EVE M 03/26/2012, 1:35 PM

## 2012-03-26 NOTE — MAU Note (Signed)
Patient states she is 3-4 days late for her period and thinks she is pregnant. Denies pain or bleeding.

## 2012-03-26 NOTE — MAU Provider Note (Signed)
Attestation of Attending Supervision of Advanced Practitioner (CNM/NP): Evaluation and management procedures were performed by the Advanced Practitioner under my supervision and collaboration.  I have reviewed the Advanced Practitioner's note and chart, and I agree with the management and plan.  Martha Soltys, MD, FACOG Attending Obstetrician & Gynecologist Faculty Practice, [facility]  

## 2012-04-08 ENCOUNTER — Inpatient Hospital Stay (HOSPITAL_COMMUNITY): Payer: Self-pay

## 2012-04-08 ENCOUNTER — Inpatient Hospital Stay (HOSPITAL_COMMUNITY)
Admission: AD | Admit: 2012-04-08 | Discharge: 2012-04-08 | Disposition: A | Discharge status: Home / Self Care | Payer: Self-pay | Source: Ambulatory Visit | Attending: Obstetrics & Gynecology | Admitting: Obstetrics & Gynecology

## 2012-04-08 ENCOUNTER — Encounter (HOSPITAL_COMMUNITY): Payer: Self-pay | Admitting: *Deleted

## 2012-04-08 DIAGNOSIS — N949 Unspecified condition associated with female genital organs and menstrual cycle: Secondary | ICD-10-CM | POA: Insufficient documentation

## 2012-04-08 DIAGNOSIS — B9689 Other specified bacterial agents as the cause of diseases classified elsewhere: Secondary | ICD-10-CM | POA: Insufficient documentation

## 2012-04-08 DIAGNOSIS — N760 Acute vaginitis: Secondary | ICD-10-CM | POA: Insufficient documentation

## 2012-04-08 DIAGNOSIS — A499 Bacterial infection, unspecified: Secondary | ICD-10-CM | POA: Insufficient documentation

## 2012-04-08 DIAGNOSIS — N938 Other specified abnormal uterine and vaginal bleeding: Secondary | ICD-10-CM | POA: Insufficient documentation

## 2012-04-08 HISTORY — DX: Abnormal uterine and vaginal bleeding, unspecified: N93.9

## 2012-04-08 LAB — URINALYSIS, ROUTINE W REFLEX MICROSCOPIC
Glucose, UA: NEGATIVE mg/dL
Ketones, ur: 15 mg/dL — AB
Leukocytes, UA: NEGATIVE
pH: 5.5 (ref 5.0–8.0)

## 2012-04-08 LAB — WET PREP, GENITAL

## 2012-04-08 LAB — URINE MICROSCOPIC-ADD ON

## 2012-04-08 LAB — POCT PREGNANCY, URINE: Preg Test, Ur: NEGATIVE

## 2012-04-08 MED ORDER — METRONIDAZOLE 500 MG PO TABS: 500.0000 mg | ORAL_TABLET | Freq: Two times a day (BID) | ORAL | Status: DC

## 2012-04-08 MED ORDER — KETOROLAC TROMETHAMINE 60 MG/2ML IM SOLN: 60.0000 mg | Freq: Once | INTRAMUSCULAR | Status: DC

## 2012-04-08 NOTE — MAU Provider Note (Signed)
Attestation of Attending Supervision of Advanced Practitioner (CNM/NP): Evaluation and management procedures were performed by the Advanced Practitioner under my supervision and collaboration.  I have reviewed the Advanced Practitioner's note and chart, and I agree with the management and plan.  Mathew Storck, MD, FACOG Attending Obstetrician & Gynecologist Faculty Practice, [facility]  

## 2012-04-08 NOTE — MAU Note (Signed)
Pt states she came earlier last month for late cycle which came on 03/27/2012 and ended 04/01/2012. 2 days ago took 3 800mg  ibuprofen on empty stomach. Didn't realize they were 800mg  each. Has felt nauseated yesterday and noted cramping. Came in for eval

## 2012-04-08 NOTE — MAU Provider Note (Signed)
History     CSN: [id number]  Arrival date and time: 04/08/12 4098   First Provider Initiated Contact with Patient 04/08/12 [phone]      Chief Complaint  Patient presents with  . Pelvic Pain   HPI  Pt is here with report of abnormal vaginal bleeding that started today.  Reports having a cycle on 02/25/12, which lasted approximately 6 days.  Another cycle started on 03/27/12 which lasted approximately 6 days.  Bleeding returned today.  Bleeding is described as "spotting".  Also reports nausea and midpelvic pain.  No UTI symptoms or abnormal vaginal discharge.  Hx of regular cycles.    Past Medical History  Diagnosis Date  . No pertinent past medical history   . Abnormal vaginal bleeding     Past Surgical History  Procedure Date  . No past surgeries     History reviewed. No pertinent family history.  History  Substance Use Topics  . Smoking status: Current Every Day Smoker -- 1.0 packs/day  . Smokeless tobacco: Never Used  . Alcohol Use: No    Allergies: No Known Allergies  Prescriptions prior to admission  Medication Sig Dispense Refill  . ibuprofen (ADVIL,MOTRIN) 200 MG tablet Take 200 mg by mouth every 6 (six) hours as needed. pain        Review of Systems  Gastrointestinal: Positive for nausea and abdominal pain (pelvic pain). Negative for vomiting.  Genitourinary:       Vaginal bleeding   Physical Exam   Last menstrual period 03/27/2012.  Physical Exam  Constitutional: She is oriented to person, place, and time. She appears well-developed and well-nourished. No distress.  HENT:  Head: Normocephalic.  Neck: Normal range of motion. Neck supple.  Cardiovascular: Normal rate, regular rhythm and normal heart sounds.  Exam reveals no gallop and no friction rub.   No murmur heard. Respiratory: Effort normal and breath sounds normal. No respiratory distress.  GI: She exhibits no mass. There is no tenderness. There is no rebound, no guarding and no CVA tenderness.    Genitourinary: Cervix exhibits no motion tenderness and no discharge. Vaginal discharge (white, creamy; scant blood) found.  Musculoskeletal: Normal range of motion.  Neurological: She is alert and oriented to person, place, and time.  Skin: Skin is warm and dry.  Psychiatric: She has a normal mood and affect.    MAU Course  Procedures  Results for orders placed during the hospital encounter of 04/08/12 (from the past 24 hour(s))  URINALYSIS, ROUTINE W REFLEX MICROSCOPIC     Status: Abnormal   Collection Time   04/08/12  8:00 AM      Component Value Range   Color, Urine YELLOW  YELLOW   APPearance CLOUDY (*) CLEAR   Specific Gravity, Urine >1.030 (*) 1.005 - 1.030   pH 5.5  5.0 - 8.0   Glucose, UA NEGATIVE  NEGATIVE mg/dL   Hgb urine dipstick MODERATE (*) NEGATIVE   Bilirubin Urine MODERATE (*) NEGATIVE   Ketones, ur 15 (*) NEGATIVE mg/dL   Protein, ur 478 (*) NEGATIVE mg/dL   Urobilinogen, UA 1.0  0.0 - 1.0 mg/dL   Nitrite NEGATIVE  NEGATIVE   Leukocytes, UA NEGATIVE  NEGATIVE  URINE MICROSCOPIC-ADD ON     Status: Abnormal   Collection Time   04/08/12  8:00 AM      Component Value Range   Squamous Epithelial / LPF RARE  RARE   WBC, UA 3-6  <3 WBC/hpf   RBC / HPF 0-2  <  3 RBC/hpf   Bacteria, UA MANY (*) RARE  POCT PREGNANCY, URINE     Status: Normal   Collection Time   04/08/12  8:28 AM      Component Value Range   Preg Test, Ur NEGATIVE  NEGATIVE  WET PREP, GENITAL     Status: Abnormal   Collection Time   04/08/12  8:41 AM      Component Value Range   Yeast Wet Prep HPF POC NONE SEEN  NONE SEEN   Trich, Wet Prep NONE SEEN  NONE SEEN   Clue Cells Wet Prep HPF POC FEW (*) NONE SEEN   WBC, Wet Prep HPF POC FEW (*) NONE SEEN   Ultrasound: IMPRESSION:  1. Normal retroverted uterus. Endometrial thickness measures 11  mm. If bleeding remains unresponsive to hormonal or medical  therapy, sonohysterogram should be considered for focal lesion work-  up. (Ref:  Radiological Reasoning: Algorithmic Workup of Abnormal  Vaginal Bleeding with Endovaginal Sonography and Sonohysterography.  AJR 2008; 161:W96-04)  2. Normal ovaries. No adnexal mass identified.  Assessment and Plan  Bacterial Vaginosis Abnormal Vaginal Bleeding  Plan: DC to home RX Flagyl Follow-up prn  [facility] 04/08/2012, 8:29 AM

## 2012-04-10 LAB — URINE CULTURE

## 2012-04-16 ENCOUNTER — Emergency Department (HOSPITAL_COMMUNITY)
Admission: EM | Admit: 2012-04-16 | Discharge: 2012-04-17 | Disposition: A | Discharge status: Home / Self Care | Payer: Self-pay | Attending: Emergency Medicine | Admitting: Emergency Medicine

## 2012-04-16 DIAGNOSIS — L0291 Cutaneous abscess, unspecified: Secondary | ICD-10-CM

## 2012-04-16 DIAGNOSIS — F17200 Nicotine dependence, unspecified, uncomplicated: Secondary | ICD-10-CM | POA: Insufficient documentation

## 2012-04-16 DIAGNOSIS — L02219 Cutaneous abscess of trunk, unspecified: Secondary | ICD-10-CM | POA: Insufficient documentation

## 2012-04-16 NOTE — ED Notes (Signed)
Pt c/o abcess to R groin area for a few weeks. Pt states the same area has been I&Ded earlier, but it has come back.

## 2012-04-17 NOTE — ED Provider Notes (Signed)
Medical screening examination/treatment/procedure(s) were performed by non-physician practitioner and as supervising physician I was immediately available for consultation/collaboration.  Olivia Mackie, MD 04/17/12 [phone]

## 2012-04-17 NOTE — ED Provider Notes (Signed)
History     CSN: [id number]  Arrival date & time 04/16/12  2335   First MD Initiated Contact with Patient 04/16/12 2351      Chief Complaint  Patient presents with  . Recurrent Skin Infections    (Consider location/radiation/quality/duration/timing/severity/associated sxs/prior treatment) HPI Comments: 31 year old female presents the emergency department with her spells complaining of an abscess to her right groin area that has been there "for a while". She states she had it drained a while ago and it went away. Three days ago she states she wore a very tight in his better ear dictating which she believes caused the abscess to return. Denies discharge or color change to the abscess. Pain rated 5/10, worse with palpation. She has not tried any alleviating factors. Denies fever or chills.  The history is provided by the patient and the spouse.    Past Medical History  Diagnosis Date  . No pertinent past medical history   . Abnormal vaginal bleeding     Past Surgical History  Procedure Date  . No past surgeries     No family history on file.  History  Substance Use Topics  . Smoking status: Current Every Day Smoker -- 1.0 packs/day  . Smokeless tobacco: Never Used  . Alcohol Use: No    OB History    Grav Para Term Preterm Abortions TAB SAB Ect Mult Living   3 3 3   0     2      Review of Systems  Constitutional: Negative for fever and chills.  Gastrointestinal: Negative for nausea.  Skin: Negative for color change.       Positive for abscess  Neurological: Negative for numbness.    Allergies  Review of patient's allergies indicates no known allergies.  Home Medications   Current Outpatient Rx  Name  Route  Sig  Dispense  Refill  . IBUPROFEN 200 MG PO TABS   Oral   Take 200 mg by mouth every 6 (six) hours as needed. pain         . METRONIDAZOLE 500 MG PO TABS   Oral   Take 1 tablet (500 mg total) by mouth 2 (two) times daily.   14 tablet   0      BP 102/59  Pulse 83  Temp 98.7 F (37.1 C)  Resp 16  SpO2 99%  LMP 03/27/2012  Physical Exam  Constitutional: She is oriented to person, place, and time. She appears well-developed and well-nourished. No distress.  HENT:  Head: Normocephalic and atraumatic.  Eyes: Conjunctivae normal are normal.  Neck: Normal range of motion.  Cardiovascular: Normal rate, regular rhythm and normal heart sounds.   Pulmonary/Chest: Effort normal and breath sounds normal.  Musculoskeletal: Normal range of motion.  Neurological: She is alert and oriented to person, place, and time.  Skin: Skin is warm, dry and intact.     Psychiatric: Her speech is normal and behavior is normal. Her mood appears anxious.    ED Course  Procedures (including critical care time) INCISION AND DRAINAGE Performed by: Johnnette Gourd Consent: Verbal consent obtained. Risks and benefits: risks, benefits and alternatives were discussed Type: abscess  Body area: right groin  Anesthesia: local infiltration  Local anesthetic: lidocaine 2% with epinephrine  Anesthetic total: 4 ml  Complexity: complex Incised with scalpel Blunt dissection to break up loculations  Drainage: purulent  Drainage amount: medium  Packing material: none  Patient tolerance: Patient tolerated the procedure well with no immediate complications.  Labs Reviewed - No data to display No results found.   1. Abscess       MDM  31 year old female with a right groin abscess with cellulitis. Abscess was drained without any problem. No packing needed. Return precautions discussed in detail.        Trevor Mace, PA-C 04/17/12 [phone]

## 2012-05-26 ENCOUNTER — Emergency Department (HOSPITAL_COMMUNITY)
Admission: EM | Admit: 2012-05-26 | Discharge: 2012-05-26 | Disposition: A | Discharge status: Home / Self Care | Payer: Self-pay | Attending: Emergency Medicine | Admitting: Emergency Medicine

## 2012-05-26 ENCOUNTER — Encounter (HOSPITAL_COMMUNITY): Payer: Self-pay | Admitting: *Deleted

## 2012-05-26 DIAGNOSIS — L02219 Cutaneous abscess of trunk, unspecified: Secondary | ICD-10-CM | POA: Insufficient documentation

## 2012-05-26 DIAGNOSIS — L0291 Cutaneous abscess, unspecified: Secondary | ICD-10-CM

## 2012-05-26 DIAGNOSIS — L03319 Cellulitis of trunk, unspecified: Secondary | ICD-10-CM | POA: Insufficient documentation

## 2012-05-26 DIAGNOSIS — F17200 Nicotine dependence, unspecified, uncomplicated: Secondary | ICD-10-CM | POA: Insufficient documentation

## 2012-05-26 DIAGNOSIS — Z8742 Personal history of other diseases of the female genital tract: Secondary | ICD-10-CM | POA: Insufficient documentation

## 2012-05-26 MED ORDER — SULFAMETHOXAZOLE-TRIMETHOPRIM 800-160 MG PO TABS: 1.0000 | ORAL_TABLET | Freq: Two times a day (BID) | ORAL | Status: DC

## 2012-05-26 MED ORDER — CEPHALEXIN 500 MG PO CAPS: 500.0000 mg | ORAL_CAPSULE | Freq: Four times a day (QID) | ORAL | Status: DC

## 2012-05-26 NOTE — ED Provider Notes (Signed)
History     CSN: [id number]  Arrival date & time 05/26/12  2208   First MD Initiated Contact with Patient 05/26/12 2245      No chief complaint on file.   (Consider location/radiation/quality/duration/timing/severity/associated sxs/prior treatment) HPI Comments: 31 year old female with a chief complaint of abscess. Patient states that she has been seen here in the past for this same. Patient states that her pain is moderate to severe. She states that it is worsened with sitting. She has tried antibiotics, but did not really use warm compresses. She states that her symptoms have been persistent. There are no alleviating factors. She also complains of some numbness on her toe.  The history is provided by the patient. No language interpreter was used.    Past Medical History  Diagnosis Date  . No pertinent past medical history   . Abnormal vaginal bleeding     Past Surgical History  Procedure Date  . No past surgeries     No family history on file.  History  Substance Use Topics  . Smoking status: Current Every Day Smoker -- 1.0 packs/day  . Smokeless tobacco: Never Used  . Alcohol Use: No    OB History    Grav Para Term Preterm Abortions TAB SAB Ect Mult Living   3 3 3   0     2      Review of Systems  All other systems reviewed and are negative.    Allergies  Review of patient's allergies indicates no known allergies.  Home Medications   Current Outpatient Rx  Name  Route  Sig  Dispense  Refill  . HYDROCODONE-ACETAMINOPHEN PO   Oral   Take 1 tablet by mouth once.         . CEPHALEXIN 500 MG PO CAPS   Oral   Take 1 capsule (500 mg total) by mouth 4 (four) times daily.   40 capsule   0   . SULFAMETHOXAZOLE-TRIMETHOPRIM 800-160 MG PO TABS   Oral   Take 1 tablet by mouth every 12 (twelve) hours.   20 tablet   0     BP 128/70  Pulse 93  Temp 98.6 F (37 C) (Oral)  Resp 20  SpO2 98%  LMP 05/23/2012  Physical Exam  Nursing note and vitals  reviewed. Constitutional: She is oriented to person, place, and time. She appears well-developed and well-nourished.  HENT:  Head: Normocephalic and atraumatic.  Eyes: Conjunctivae normal and EOM are normal.  Neck: Normal range of motion.  Cardiovascular: Normal rate.   Pulmonary/Chest: Effort normal.  Abdominal: She exhibits no distension.  Genitourinary:          1 x 1 cm abscess, with surrounding indurated area approximately 3 x 3 cm. Minimal fluctuance.  Musculoskeletal: Normal range of motion.  Neurological: She is alert and oriented to person, place, and time.  Skin: Skin is dry.  Psychiatric: She has a normal mood and affect. Her behavior is normal. Judgment and thought content normal.    ED Course  Procedures (including critical care time)  Labs Reviewed - No data to display No results found.   1. Abscess       MDM  31 year old female with abscess.  Patient does not want incision and drainage at this time. States that she wants to try antibiotics and warm compresses, and will return if her symptoms worsen.  Will prescribe Bactrim and Keflex, and instructed patient to use warm compresses. Patient states that she understands and agrees  with the plan. She is stable and ready for discharge.      Roxy Horseman, PA-C 05/26/12 2333

## 2012-05-26 NOTE — ED Notes (Signed)
Pt c/o recurrent abscess to right groin; previously had I&D-- does not want to have repeat I&D--wants antibiotics; also c/o left great toenail possibly being ingrown/numb at corner

## 2012-05-27 NOTE — ED Provider Notes (Signed)
Medical screening examination/treatment/procedure(s) were performed by non-physician practitioner and as supervising physician I was immediately available for consultation/collaboration.  Gerhard Munch, MD 05/27/12 [phone]

## 2013-03-05 ENCOUNTER — Encounter (HOSPITAL_COMMUNITY): Payer: Self-pay | Admitting: Emergency Medicine

## 2013-03-05 ENCOUNTER — Emergency Department (HOSPITAL_COMMUNITY)
Admission: EM | Admit: 2013-03-05 | Discharge: 2013-03-05 | Discharge status: Against Medical Advice | Payer: Medicaid Other | Attending: Emergency Medicine | Admitting: Emergency Medicine

## 2013-03-05 DIAGNOSIS — F17200 Nicotine dependence, unspecified, uncomplicated: Secondary | ICD-10-CM | POA: Insufficient documentation

## 2013-03-05 DIAGNOSIS — R51 Headache: Secondary | ICD-10-CM | POA: Insufficient documentation

## 2013-03-05 NOTE — ED Notes (Signed)
Called x 3 with no answer.

## 2013-03-05 NOTE — ED Notes (Signed)
Pt states HA that started yesterday she was walking at the time. Pt states no hx of migraines pt states that she also had left arm numbness that went with the HA. Pt states that she cannot take pills (cannot get them down) so she did not take anything and the HA went away. Pt states that the HA came back today, no numbness per patient with this HA. Pt has no neuro deficits, pt able to follow commands, move extremities and equal qrips/strength.

## 2013-09-28 ENCOUNTER — Encounter (HOSPITAL_COMMUNITY): Payer: Self-pay | Admitting: Emergency Medicine

## 2013-09-28 ENCOUNTER — Emergency Department (HOSPITAL_COMMUNITY)
Admission: EM | Admit: 2013-09-28 | Discharge: 2013-09-28 | Disposition: A | Discharge status: Home / Self Care | Payer: Medicaid Other | Source: Home / Self Care | Attending: Emergency Medicine | Admitting: Emergency Medicine

## 2013-09-28 DIAGNOSIS — M549 Dorsalgia, unspecified: Secondary | ICD-10-CM

## 2013-09-28 MED ORDER — METHOCARBAMOL 500 MG PO TABS: 500.0000 mg | ORAL_TABLET | Freq: Two times a day (BID) | ORAL | Status: DC

## 2013-09-28 MED ORDER — MELOXICAM 7.5 MG PO TABS: 7.5000 mg | ORAL_TABLET | Freq: Every day | ORAL | Status: DC

## 2013-09-28 MED ORDER — PREDNISONE 10 MG PO TABS: ORAL_TABLET | ORAL | Status: DC

## 2013-09-28 NOTE — ED Notes (Signed)
Pt  Reports  Pain  In her  Back  Radiating  Down r  Hip       Symptoms  X  sev  days   Worse  On movement   And  Certain  Positions         Pt  Ambulated  To  Room  With a  Steady fluid  Gait      Pt  denys  specefic  inj     Other than  Was  Doing  Some  Lifting and  Clearing  Debris  sev  Weeks  ago

## 2013-09-28 NOTE — ED Provider Notes (Signed)
CSN: [id number]     Arrival date & time 09/28/13  0827 History   First MD Initiated Contact with Patient 09/28/13 [phone]     Chief Complaint  Patient presents with  . Back Pain   (Consider location/radiation/quality/duration/timing/severity/associated sxs/prior Treatment) Patient is a 33 y.o. female presenting with back pain. The history is provided by the patient. No language interpreter was used.  Back Pain Location:  Lumbar spine Quality:  Aching Radiates to:  Does not radiate Pain severity:  Moderate Pain is:  Same all the time Onset quality:  Gradual Timing:  Constant Progression:  Worsening Chronicity:  New Context: occupational injury   Relieved by:  Nothing Worsened by:  Nothing tried Ineffective treatments:  None tried Associated symptoms: no abdominal pain     Past Medical History  Diagnosis Date  . No pertinent past medical history   . Abnormal vaginal bleeding    Past Surgical History  Procedure Laterality Date  . No past surgeries     History reviewed. No pertinent family history. History  Substance Use Topics  . Smoking status: Current Every Day Smoker -- 1.00 packs/day  . Smokeless tobacco: Never Used  . Alcohol Use: No   OB History   Grav Para Term Preterm Abortions TAB SAB Ect Mult Living   3 3 3   0     2     Review of Systems  Gastrointestinal: Negative for abdominal pain.  Musculoskeletal: Positive for back pain.  All other systems reviewed and are negative.   Allergies  Review of patient's allergies indicates no known allergies.  Home Medications   Prior to Admission medications   Medication Sig Start Date End Date Taking? Authorizing Provider  cephALEXin (KEFLEX) 500 MG capsule Take 1 capsule (500 mg total) by mouth 4 (four) times daily. 05/26/12   Roxy Horsemanobert Browning, PA-C  HYDROCODONE-ACETAMINOPHEN PO Take 1 tablet by mouth once.    Historical Provider, MD  meloxicam (MOBIC) 7.5 MG tablet Take 1 tablet (7.5 mg total) by mouth daily. 09/28/13    Elson AreasLeslie K Sofia, PA-C  methocarbamol (ROBAXIN) 500 MG tablet Take 1 tablet (500 mg total) by mouth 2 (two) times daily. 09/28/13   Elson AreasLeslie K Sofia, PA-C  predniSONE (DELTASONE) 10 MG tablet 6,5,4,3,2,1 taper 09/28/13   Elson AreasLeslie K Sofia, PA-C  sulfamethoxazole-trimethoprim (SEPTRA DS) 800-160 MG per tablet Take 1 tablet by mouth every 12 (twelve) hours. 05/26/12   Roxy Horsemanobert Browning, PA-C   BP 119/87  Pulse 89  Temp(Src) 98.3 F (36.8 C) (Oral)  Resp 16  SpO2 100% Physical Exam  Nursing note and vitals reviewed. Constitutional: She is oriented to person, place, and time. She appears well-developed and well-nourished.  HENT:  Head: Normocephalic and atraumatic.  Eyes: Conjunctivae and EOM are normal. Pupils are equal, round, and reactive to light.  Neck: Normal range of motion.  Cardiovascular: Normal rate and regular rhythm.   Pulmonary/Chest: Effort normal.  Abdominal: She exhibits no distension.  Musculoskeletal: Normal range of motion.  Neurological: She is alert and oriented to person, place, and time.  Skin: Skin is warm.  Psychiatric: She has a normal mood and affect.    ED Course  Procedures (including critical care time) Labs Review Labs Reviewed - No data to display  Imaging Review No results found.   MDM   1. Back pain    Robaxin Prednisone Meloxicam Schedule to    Elson AreasLeslie K Sofia, PA-C 09/28/13 [phone]

## 2013-09-28 NOTE — ED Provider Notes (Signed)
Medical screening examination/treatment/procedure(s) were performed by non-physician practitioner and as supervising physician I was immediately available for consultation/collaboration.  Merric Yost, M.D.  Gwenivere Hiraldo C Lynlee Stratton, MD 09/28/13 1943 

## 2013-09-28 NOTE — Discharge Instructions (Signed)
Back Pain, Adult Low back pain is very common. About 1 in 5 people have back pain.The cause of low back pain is rarely dangerous. The pain often gets better over time.About half of people with a sudden onset of back pain feel better in just 2 weeks. About 8 in 10 people feel better by 6 weeks.  CAUSES Some common causes of back pain include:  Strain of the muscles or ligaments supporting the spine.  Wear and tear (degeneration) of the spinal discs.  Arthritis.  Direct injury to the back. DIAGNOSIS Most of the time, the direct cause of low back pain is not known.However, back pain can be treated effectively even when the exact cause of the pain is unknown.Answering your caregiver's questions about your overall health and symptoms is one of the most accurate ways to make sure the cause of your pain is not dangerous. If your caregiver needs more information, he or she may order lab work or imaging tests (X-rays or MRIs).However, even if imaging tests show changes in your back, this usually does not require surgery. HOME CARE INSTRUCTIONS For many people, back pain returns.Since low back pain is rarely dangerous, it is often a condition that people can learn to manageon their own.   Remain active. It is stressful on the back to sit or stand in one place. Do not sit, drive, or stand in one place for more than 30 minutes at a time. Take short walks on level surfaces as soon as pain allows.Try to increase the length of time you walk each day.  Do not stay in bed.Resting more than 1 or 2 days can delay your recovery.  Do not avoid exercise or work.Your body is made to move.It is not dangerous to be active, even though your back may hurt.Your back will likely heal faster if you return to being active before your pain is gone.  Pay attention to your body when you bend and lift. Many people have less discomfortwhen lifting if they bend their knees, keep the load close to their bodies,and  avoid twisting. Often, the most comfortable positions are those that put less stress on your recovering back.  Find a comfortable position to sleep. Use a firm mattress and lie on your side with your knees slightly bent. If you lie on your back, put a pillow under your knees.  Only take over-the-counter or prescription medicines as directed by your caregiver. Over-the-counter medicines to reduce pain and inflammation are often the most helpful.Your caregiver may prescribe muscle relaxant drugs.These medicines help dull your pain so you can more quickly return to your normal activities and healthy exercise.  Put ice on the injured area.  Put ice in a plastic bag.  Place a towel between your skin and the bag.  Leave the ice on for 15-20 minutes, 03-04 times a day for the first 2 to 3 days. After that, ice and heat may be alternated to reduce pain and spasms.  Ask your caregiver about trying back exercises and gentle massage. This may be of some benefit.  Avoid feeling anxious or stressed.Stress increases muscle tension and can worsen back pain.It is important to recognize when you are anxious or stressed and learn ways to manage it.Exercise is a great option. SEEK MEDICAL CARE IF:  You have pain that is not relieved with rest or medicine.  You have pain that does not improve in 1 week.  You have new symptoms.  You are generally not feeling well. SEEK   IMMEDIATE MEDICAL CARE IF:   You have pain that radiates from your back into your legs.  You develop new bowel or bladder control problems.  You have unusual weakness or numbness in your arms or legs.  You develop nausea or vomiting.  You develop abdominal pain.  You feel faint. Document Released: 05/14/2005 Document Revised: 11/13/2011 Document Reviewed: 10/02/2010 [employer] Patient Information 2014 [employer].  

## 2014-02-28 ENCOUNTER — Encounter (HOSPITAL_COMMUNITY): Payer: Self-pay | Admitting: Emergency Medicine

## 2014-02-28 ENCOUNTER — Emergency Department (HOSPITAL_COMMUNITY)
Admission: EM | Admit: 2014-02-28 | Discharge: 2014-02-28 | Disposition: A | Discharge status: Home / Self Care | Payer: Medicaid Other | Attending: Emergency Medicine | Admitting: Emergency Medicine

## 2014-02-28 DIAGNOSIS — F329 Major depressive disorder, single episode, unspecified: Secondary | ICD-10-CM | POA: Diagnosis present

## 2014-02-28 DIAGNOSIS — F12129 Cannabis abuse with intoxication, unspecified: Secondary | ICD-10-CM | POA: Diagnosis not present

## 2014-02-28 DIAGNOSIS — Z8742 Personal history of other diseases of the female genital tract: Secondary | ICD-10-CM | POA: Diagnosis not present

## 2014-02-28 DIAGNOSIS — Z720 Tobacco use: Secondary | ICD-10-CM | POA: Insufficient documentation

## 2014-02-28 DIAGNOSIS — F14129 Cocaine abuse with intoxication, unspecified: Secondary | ICD-10-CM | POA: Diagnosis not present

## 2014-02-28 DIAGNOSIS — F419 Anxiety disorder, unspecified: Secondary | ICD-10-CM | POA: Diagnosis not present

## 2014-02-28 DIAGNOSIS — F32A Depression, unspecified: Secondary | ICD-10-CM

## 2014-02-28 LAB — COMPREHENSIVE METABOLIC PANEL
ALBUMIN: 4.9 g/dL (ref 3.5–5.2)
ALT: 8 U/L (ref 0–35)
ANION GAP: 19 — AB (ref 5–15)
AST: 15 U/L (ref 0–37)
Alkaline Phosphatase: 102 U/L (ref 39–117)
BUN: 9 mg/dL (ref 6–23)
CALCIUM: 9.9 mg/dL (ref 8.4–10.5)
CO2: 19 mEq/L (ref 19–32)
Chloride: 101 mEq/L (ref 96–112)
Creatinine, Ser: 0.9 mg/dL (ref 0.50–1.10)
GFR calc non Af Amer: 84 mL/min — ABNORMAL LOW (ref >90)
GLUCOSE: 135 mg/dL — AB (ref 70–99)
Potassium: 3 mEq/L — ABNORMAL LOW (ref 3.7–5.3)
SODIUM: 139 meq/L (ref 137–147)
TOTAL PROTEIN: 9.1 g/dL — AB (ref 6.0–8.3)
Total Bilirubin: 0.7 mg/dL (ref 0.3–1.2)

## 2014-02-28 LAB — URINALYSIS, ROUTINE W REFLEX MICROSCOPIC
BILIRUBIN URINE: NEGATIVE
Glucose, UA: NEGATIVE mg/dL
KETONES UR: 15 mg/dL — AB
Nitrite: NEGATIVE
PH: 6 (ref 5.0–8.0)
Protein, ur: NEGATIVE mg/dL
SPECIFIC GRAVITY, URINE: 1.012 (ref 1.005–1.030)
Urobilinogen, UA: 1 mg/dL (ref 0.0–1.0)

## 2014-02-28 LAB — HEPATIC FUNCTION PANEL
ALT: 8 U/L (ref 0–35)
AST: 14 U/L (ref 0–37)
Albumin: 4.9 g/dL (ref 3.5–5.2)
Alkaline Phosphatase: 101 U/L (ref 39–117)
Total Bilirubin: 0.5 mg/dL (ref 0.3–1.2)
Total Protein: 9.1 g/dL — ABNORMAL HIGH (ref 6.0–8.3)

## 2014-02-28 LAB — URINE MICROSCOPIC-ADD ON

## 2014-02-28 LAB — CBC WITH DIFFERENTIAL/PLATELET
BASOS ABS: 0 10*3/uL (ref 0.0–0.1)
BASOS PCT: 0 % (ref 0–1)
EOS ABS: 0 10*3/uL (ref 0.0–0.7)
EOS PCT: 0 % (ref 0–5)
HCT: 40.3 % (ref 36.0–46.0)
Hemoglobin: 14 g/dL (ref 12.0–15.0)
LYMPHS ABS: 2 10*3/uL (ref 0.7–4.0)
Lymphocytes Relative: 17 % (ref 12–46)
MCH: 31.2 pg (ref 26.0–34.0)
MCHC: 34.7 g/dL (ref 30.0–36.0)
MCV: 89.8 fL (ref 78.0–100.0)
Monocytes Absolute: 0.8 10*3/uL (ref 0.1–1.0)
Monocytes Relative: 7 % (ref 3–12)
Neutro Abs: 9.1 10*3/uL — ABNORMAL HIGH (ref 1.7–7.7)
Neutrophils Relative %: 76 % (ref 43–77)
PLATELETS: 333 10*3/uL (ref 150–400)
RBC: 4.49 MIL/uL (ref 3.87–5.11)
RDW: 14.2 % (ref 11.5–15.5)
WBC: 12 10*3/uL — ABNORMAL HIGH (ref 4.0–10.5)

## 2014-02-28 LAB — SALICYLATE LEVEL: Salicylate Lvl: 8.6 mg/dL (ref 2.8–20.0)

## 2014-02-28 LAB — RAPID URINE DRUG SCREEN, HOSP PERFORMED
Amphetamines: NOT DETECTED
BARBITURATES: NOT DETECTED
BENZODIAZEPINES: NOT DETECTED
COCAINE: POSITIVE — AB
Opiates: NOT DETECTED
TETRAHYDROCANNABINOL: POSITIVE — AB

## 2014-02-28 LAB — I-STAT CHEM 8, ED
BUN: 8 mg/dL (ref 6–23)
CALCIUM ION: 1.06 mmol/L — AB (ref 1.12–1.23)
Chloride: 107 mEq/L (ref 96–112)
Creatinine, Ser: 1 mg/dL (ref 0.50–1.10)
GLUCOSE: 141 mg/dL — AB (ref 70–99)
HEMATOCRIT: 44 % (ref 36.0–46.0)
HEMOGLOBIN: 15 g/dL (ref 12.0–15.0)
Potassium: 3.3 mEq/L — ABNORMAL LOW (ref 3.7–5.3)
Sodium: 140 mEq/L (ref 137–147)
TCO2: 21 mmol/L (ref 0–100)

## 2014-02-28 LAB — ACETAMINOPHEN LEVEL
ACETAMINOPHEN (TYLENOL), SERUM: 47.6 ug/mL — AB (ref 10–30)
Acetaminophen (Tylenol), Serum: 36.7 ug/mL — ABNORMAL HIGH (ref 10–30)

## 2014-02-28 LAB — HCG, SERUM, QUALITATIVE: PREG SERUM: NEGATIVE

## 2014-02-28 MED ORDER — NICOTINE 21 MG/24HR TD PT24
21.0000 mg | MEDICATED_PATCH | Freq: Every day | TRANSDERMAL | Status: DC
  Administered 2014-02-28: 21 mg via TRANSDERMAL
  Filled 2014-02-28: qty 1

## 2014-02-28 MED ORDER — SODIUM CHLORIDE 0.9 % IV BOLUS (SEPSIS)
1000.0000 mL | Freq: Once | INTRAVENOUS | Status: AC
Start: 2014-02-28 — End: 2014-02-28
  Administered 2014-02-28: 1000 mL via INTRAVENOUS

## 2014-02-28 NOTE — ED Notes (Signed)
Pt mother at bedside and pt is talking on the phone with her husband. Pt is calm and cooperative at this time

## 2014-02-28 NOTE — ED Notes (Signed)
Pt reports that she did not take Tylenol and only said that she because she wanted her husband back. Pt denies SI/HI. Pt sts that she took 4 extra strength tylenol because she had a headache. Pt is A&O and in NAD

## 2014-02-28 NOTE — ED Notes (Signed)
Pt in via EMS- Pt had an argument with husband a few days ago, family called EMS and reports that pt was driving around and called family stating that she was going to kill herself. Pt told EMS that she ingested 10 extra stregnthTylenol and 10 [facility] Extra strength headache pills, but she does not know what it is or the ingredients. En route, pt told EMS that she only took enough Tylenol for her HA and said that she only said she was going to kill herself because she wanted her husband to come and get her. Pt husband showed EMS text that pt sent stating that she was going to take pills and kill herself. Pt denies SI with EMS and says she was only "doing for attention." Pt is A&O and in NAD

## 2014-02-28 NOTE — ED Notes (Signed)
Pt requesting to leave. PA Joni Reiningicole brought to bedside to discuss plan with patient. Pt was made aware that she is not able to leave facility and if she refuses to stay IVC will be an option. Patient has agreed to stay with no IVC papers. She is aware that more blood will need to be drawn and she is waiting for TTS which may take hours. She is requesting a smoke break and she was made aware that she can not leave department but we can give her a nicotine patch. Food also offered to the patient.

## 2014-02-28 NOTE — ED Notes (Signed)
Bed: [id number] Expected date: 02/28/14 Expected time: 12:06 PM Means of arrival: Ambulance Comments: SI Attempt with Tylenol OD

## 2014-02-28 NOTE — Discharge Instructions (Signed)
Do not hesitate to return to the emergency room for any new, worsening or concerning symptoms. ° °Please obtain primary care using resource guide below. But the minute you were seen in the emergency room and that they will need to obtain records for further outpatient management. ° ° ° °Emergency Department Resource Guide °1) Find a Doctor and Pay Out of Pocket °Although you won't have to find out who is covered by your insurance plan, it is a good idea to ask around and get recommendations. You will then need to call the office and see if the doctor you have chosen will accept you as a new patient and what types of options they offer for patients who are self-pay. Some doctors offer discounts or will set up payment plans for their patients who do not have insurance, but you will need to ask so you aren't surprised when you get to your appointment. ° °2) Contact Your Local Health Department °Not all health departments have doctors that can see patients for sick visits, but many do, so it is worth a call to see if yours does. If you don't know where your local health department is, you can check in your phone book. The CDC also has a tool to help you locate your state's health department, and many state websites also have listings of all of their local health departments. ° °3) Find a Walk-in Clinic °If your illness is not likely to be very severe or complicated, you may want to try a walk in clinic. These are popping up all over the country in pharmacies, drugstores, and shopping centers. They're usually staffed by nurse practitioners or physician assistants that have been trained to treat common illnesses and complaints. They're usually fairly quick and inexpensive. However, if you have serious medical issues or chronic medical problems, these are probably not your best option. ° °No Primary Care Doctor: °- Call [facility] at  832-8000 - they can help you locate a primary care doctor that  accepts your  insurance, provides certain services, etc. °- Physician Referral Service- 1-800-533-3463 ° °Chronic Pain Problems: °Organization         Address  Phone   Notes  °[facility]  (336) 297-2271 Patients need to be referred by their primary care doctor.  ° °Medication Assistance: °Organization         Address  Phone   Notes  °[place] [facility] [address] °[place], [place] [address] (336) 641-8030 --Must be a resident of [place] °-- Must have NO insurance coverage whatsoever (no Medicaid/ Medicare, etc.) °-- The pt. MUST have a primary care doctor that directs their care regularly and follows them in the community °  °[facility]  (866) 331-1348   °[employer]  (888) 892-1162   ° °Agencies that provide inexpensive medical care: °Organization         Address  Phone   Notes  °[facility]  (336) 832-8035   °[facility]    (336) 832-7272   °[facility] [address] °[place], [place] [address] (336) 832-4777   °[facility] [address], °[place] (336) 271-4999   °[facility]    (336) 373-0678   °[facility]    (336) 272-1050   °[facility] ° [address], [place] Phone:  (336) 832-4444, Fax:  (336) 832-4440 Hours of Operation:  9 am -   6 pm, M-F.  Also accepts Medicaid/Medicare and self-pay.  °[facility] ° [address], [place] Phone: (336) 832-3150, Fax: (336) 832-3151. Hours of Operation:  8:30 am - 5:30 pm, M-F.  Also accepts Medicaid and self-pay.  °[facility] [place] [address], [place] Phone: (336) 878-6027   °[facility] [address], [place], [place] (336)723-1848, Ext. 123 Mondays & Thursdays: 7-9 AM.  First 15 patients are seen on a first come, first serve basis. °  ° °Medicaid-accepting [place] Providers: ° °Organization          Address  Phone   Notes  °[facility] [address], [place] (336) 641-2100 Also accepts self-pay patients.  °[facility] [address], [place] ° (336) 856-9996   °[facility] [address], [place] (336) 288-8857   °[facility] [address] [place] [address], [place] (336) 299-7000   °Veita Bland [address], [place]  ° (336) 373-1557 Only accepts [place] Access Medicaid patients after they have their name applied to their card.  ° °Self-Pay (no insurance) in [place]: ° °Organization         Address  Phone   Notes  °Sickle Cell Patients, [facility] [address], [place] (336) 832-1970   °[facility] [address], [place] (336) 832-4400   ° [facility] ° [address], [place] (336) 992-4800   °[facility]/Dr. Osei-Bonsu ° [address] [place] [address], [place] or [address], [place] (336) 841-8500 Phone number for both [place] and [place] locations is the same.  °[facility] [address], [place] (336) 299-0000   °[facility] [address] [place] [address], [place] or [address] (336) 852-7530 °(336) 878-2260   °[facility] [address], [place] (336) 350-1642, phone; (336) 294-5005, fax Sees patients 1st and 3rd Saturday of every month.  Must not qualify for public or private insurance (i.e. Medicaid, Medicare, [place] Health Choice, Veterans' Benefits) • Household income should be no more than 200% of the poverty level •The clinic cannot treat you if you are pregnant or think you are pregnant • Sexually transmitted diseases are not treated at the clinic.  ° ° °Dental Care: °Organization         Address  Phone  Notes  °[facility] [address],  [place] (336) 641-6152 Accepts children up to age 21 who are enrolled in Medicaid or [place] Health Choice; pregnant women with a Medicaid card; and children who have applied for Medicaid or [place] Health Choice, but were declined, whose parents can pay a reduced fee at time of service.  °[facility] [place]  [address], [place] (336) 641-7733 Accepts children up to age 21 who are enrolled in Medicaid or [place] Health Choice; pregnant women with a Medicaid card; and children who have applied for Medicaid or [place] Health Choice, but were declined, whose parents can pay a reduced fee at time of service.  °[facility] ° [address], [place] (336) 641-4533 Patients are seen by appointment only. Walk-ins are not accepted. [facility] will see patients 18 years of age and   older. °Monday - Tuesday (8am-5pm) °Most Wednesdays (8:30-5pm) °$30 per visit, cash only  °[facility] ° [address], [place] (336) 641-4533 Patients are seen by appointment only. Walk-ins are not accepted. [facility] will see patients 18 years of age and older. °One Wednesday Evening (Monthly: Volunteer Based).  $30 per visit, cash only  °[facility]  (919) 537-3737 for adults; Children under age 4, call Graduate Pediatric Dentistry at (919) [phone]. Children aged 4-14, please call (919) [phone] to request a pediatric application. ° Dental services are provided in all areas of dental care including fillings, crowns and bridges, complete and partial dentures, implants, gum treatment, root canals, and extractions. Preventive care is also provided. Treatment is provided to both adults and children. °Patients are selected via a lottery and there is often a waiting list. °  °[facility] [address], °[place] ° (336) 763-8833 [email or web address] °  °[facility] [address], [place], [place]  (336)723-1848, Ext. 123 Second and Fourth Thursday of each month, opens at 6:30 AM; Clinic ends at 9 AM.  Patients are seen on a first-come first-served basis, and a limited number are seen during each clinic.  ° °[facility] ° [address], [place], [place] (336) 723-7904   Eligibility Requirements °You must have lived in [place], [place], or [place] counties for at least the last three months. °  You cannot be eligible for state or federal sponsored healthcare insurance, including Veterans Administration, Medicaid, or Medicare. °  You generally cannot be eligible for healthcare insurance through your employer.  °  How to apply: °Eligibility screenings are held every Tuesday and Wednesday afternoon from 1:00 pm until 4:00 pm. You do not need an appointment for the interview!  °[facility] [address], [place], [place] 336-631-2330   °[facility]  336-342-8273   °[facility]  336-703-3100   °[facility]  336-570-6415   ° °Behavioral Health Resources in the Community: °Intensive Outpatient Programs °Organization         Address  Phone  Notes  °[facility] [address], [place], [place] 336-878-6098   °[facility] [address], [place], [place] 336-832-9800   °[facility] [address], [place], [place] ° 336-882-2125   °[facility] [address],  °[place], [place] 1-800-853-5163 or 336-641-4981   °Substance Abuse Resources °Organization         Address  Phone  Notes  °[facility]  336-882-2125   °[facility]  336-784-9470   °[facility]  336-285-9073   °[facility]  336-845-3988   °Residential & Outpatient Substance Abuse Program  1-800-659-3381   °Psychological Services °Organization         Address  Phone  Notes  °[facility]  336- 832-9600   °[facility]  336- 378-7881    °[facility] [address], [place] 1-800-853-5163 or 336-641-4981   ° °Mobile Crisis Teams °Organization         Address  Phone  Notes  °[facility], Mobile Crisis Care Unit  1-877-626-1772   °Assertive °[facility] ° [address] [place], [place] 336-834-9664   °Sharon DeEsch [address] °[place] 336-554-5454   ° °Self-Help/Support Groups °Organization         Address    Phone             Notes  °[facility] - variety of support groups  336- 373-1402 Call for more information  °Narcotics Anonymous (NA), [facility] [address], °[place]  2 meetings at this location  ° °Residential Treatment Programs °Organization         Address  Phone  Notes  °[facility] [address],    °[place]  1-866-801-8205   °[facility] ° [address], [place], [place] 704-293-8524   °[facility] [address], [place] 336-845-3988 Admissions: 8am-3pm M-F  °[facility] [address]    °[place], [place] 336-841-1104   °[facility] [address], [place], [place] 336-379-7146   °[facility] [address]  °[place], [place] 336-285-9073   °[facility] - Intensive Outpatient [address], [place], [place] 336-852-3033   °[facility] [address]  °[place], [place] 1-877-615-2722 or 336-784-9470   °[facility] [address] [place], [place] 336-227-7417 Accepts Medicaid  °[facility] [address]  °[place] 1-800-659-3381 Substance Abuse/Addiction Treatment  ° °[place] Behavioral Health Resources °Organization         Address  Phone  Notes  °[facility]  (888) 581-9988   °Julie Brannon, PhD [address] [place], [place]   (336) 349-5553 or (336) 951-0000   °[facility]   [address] °[place], [place] (336) 349-4454   °[facility] [address], [place], [place] (336) 342-8316 Insurance/Medicaid/sponsorship through [facility]  °[facility] [address]                                    [place], [place] (336) 342-8316 Therapy/tele-psych/case  °[facility] [address]  ° [place], [place] (336) 349-2233    °Dr. Arfeen  (336) 349-4544   °[facility] [place]  [employer] [place] [facility] 1) [address], [place] °2) [address], [place] °3)  [address], [place] (336) 349-3220 °(336) 342-7768 ° °(336) 342-8140   °[place] [facility] (336) 342-1394 or (336) 342-3537 (After Hours)    ° ° ° °

## 2014-02-28 NOTE — ED Notes (Signed)
Christa SeeNicole P, PA states that she is calling poison control in regards to tx for pt

## 2014-02-28 NOTE — ED Notes (Signed)
Clinician consulted with Shuvon Rankin,NP who states that patient will benefit from outpatient services for counseling and potentially medication management. Patient currently denies SI/HI/AVH and is psychiatrically cleared. Clinician provided patient information for outpatient services. No other concerns addressed by patient or husband.   Joni ReiningNicole, PA-C notified by Clinical research associatewriter.  Janann ColonelGregory Pickett Jr. MSW, LCSW [facility]-Triage Specialist   Phone: [phone] Fax: [phone]

## 2014-02-28 NOTE — ED Provider Notes (Signed)
CSN: [id number]     Arrival date & time 02/28/14  1208 History   First MD Initiated Contact with Patient 02/28/14 1209     Chief Complaint  Patient presents with  . Drug Overdose  . Suicidal     (Consider location/radiation/quality/duration/timing/severity/associated sxs/prior Treatment) HPI   Casey Duncan is a 33 y.o. female with no significant past medical history, brought in by EMS for reported attempted overdose and suicide attempt. Patient denies any suicide attempt or ingestion. States that she told her ex-husband she was going to kill herself in an attempt to get him to reconcile with her. Patient denies any history of alcohol abuse, drug abuse besides marijuana, history of suicide attempts, history of liver dysfunction, abdominal pain, nausea vomiting, change in bowel or bladder habits, chest pain, shortness of breath, history of domestic abuse. Patient states that she left her home with her husband 2 days ago and she's been essentially homeless since that time staying with relatives and in her car. She has been trying to reconcile with her husband but they thought this morning and she said that she tested him that she would kill herself because nobody cares about her. EMS and local PT were called. Patient tells me that she took 3 [employer] generic headache medication at 4 AM. She denies any Tylenol ingestion to me however this is not what triage was told.    Past Medical History  Diagnosis Date  . No pertinent past medical history   . Abnormal vaginal bleeding    Past Surgical History  Procedure Laterality Date  . No past surgeries     No family history on file. History  Substance Use Topics  . Smoking status: Current Every Day Smoker -- 1.00 packs/day  . Smokeless tobacco: Never Used  . Alcohol Use: No     Comment: Patient denies   OB History   Grav Para Term Preterm Abortions TAB SAB Ect Mult Living   3 3 3   0     2     Review of Systems    Allergies   Review of patient's allergies indicates no known allergies.  Home Medications   Prior to Admission medications   Medication Sig Start Date End Date Taking? Authorizing Provider  acetaminophen (TYLENOL) 500 MG tablet Take 2,000 mg by mouth once.   Yes Historical Provider, MD  aspirin-acetaminophen-caffeine (EXCEDRIN MIGRAINE) [phone] MG per tablet Take 4 tablets by mouth once.   Yes Historical Provider, MD   BP 120/69  Pulse 83  Temp(Src) 98.4 F (36.9 C) (Oral)  Resp 20  SpO2 100%  LMP 02/07/2014 Physical Exam  Nursing note and vitals reviewed. Constitutional: She is oriented to person, place, and time. She appears well-developed and well-nourished. No distress.  HENT:  Head: Normocephalic and atraumatic.  Mouth/Throat: Oropharynx is clear and moist.  Eyes: Conjunctivae and EOM are normal. Pupils are equal, round, and reactive to light.  Neck: Normal range of motion. Neck supple.  Cardiovascular: Normal rate, regular rhythm and intact distal pulses.   Pulmonary/Chest: Effort normal and breath sounds normal. No stridor. No respiratory distress. She has no wheezes. She has no rales. She exhibits no tenderness.  Abdominal: Soft. Bowel sounds are normal. She exhibits no distension and no mass. There is no tenderness. There is no rebound and no guarding.  Musculoskeletal: Normal range of motion.  Neurological: She is alert and oriented to person, place, and time.  Psychiatric: Her speech is normal and behavior is normal. Her  mood appears anxious. She expresses no homicidal and no suicidal ideation.    ED Course  Procedures (including critical care time) Labs Review Labs Reviewed  URINALYSIS, ROUTINE W REFLEX MICROSCOPIC - Abnormal; Notable for the following:    APPearance CLOUDY (*)    Hgb urine dipstick SMALL (*)    Ketones, ur 15 (*)    Leukocytes, UA MODERATE (*)    All other components within normal limits  URINE RAPID DRUG SCREEN (HOSP PERFORMED) - Abnormal; Notable for  the following:    Cocaine POSITIVE (*)    Tetrahydrocannabinol POSITIVE (*)    All other components within normal limits  ACETAMINOPHEN LEVEL - Abnormal; Notable for the following:    Acetaminophen (Tylenol), Serum 47.6 (*)    All other components within normal limits  CBC WITH DIFFERENTIAL - Abnormal; Notable for the following:    WBC 12.0 (*)    Neutro Abs 9.1 (*)    All other components within normal limits  COMPREHENSIVE METABOLIC PANEL - Abnormal; Notable for the following:    Potassium 3.0 (*)    Glucose, Bld 135 (*)    Total Protein 9.1 (*)    GFR calc non Af Amer 84 (*)    Anion gap 19 (*)    All other components within normal limits  URINE MICROSCOPIC-ADD ON - Abnormal; Notable for the following:    Squamous Epithelial / LPF MANY (*)    All other components within normal limits  ACETAMINOPHEN LEVEL - Abnormal; Notable for the following:    Acetaminophen (Tylenol), Serum 36.7 (*)    All other components within normal limits  HEPATIC FUNCTION PANEL - Abnormal; Notable for the following:    Total Protein 9.1 (*)    All other components within normal limits  I-STAT CHEM 8, ED - Abnormal; Notable for the following:    Potassium 3.3 (*)    Glucose, Bld 141 (*)    Calcium, Ion 1.06 (*)    All other components within normal limits  URINE CULTURE  HCG, SERUM, QUALITATIVE  SALICYLATE LEVEL    Imaging Review No results found.   EKG Interpretation   Date/Time:  Sunday February 28 2014 12:14:07 EDT Ventricular Rate:  111 PR Interval:  147 QRS Duration: 93 QT Interval:  354 QTC Calculation: 481 R Axis:   73 Text Interpretation:  Sinus tachycardia Borderline repolarization  abnormality Borderline prolonged QT interval Nonspecific T wave  abnormality No previous tracing Confirmed by Denton LankSTEINL  MD, Caryn BeeKEVIN ([id number]) on  02/28/2014 12:20:31 PM      MDM   Final diagnoses:  Depression    Filed Vitals:   02/28/14 1315 02/28/14 1400 02/28/14 1455 02/28/14 1526  BP:  107/77  107/80 120/69  Pulse: 78 98  83  Temp:    98.4 F (36.9 C)  TempSrc:   Temporal Oral  Resp: 11 22 22 20   SpO2: 100% 98% 100% 100%    Medications  nicotine (NICODERM CQ - dosed in mg/24 hours) patch 21 mg (21 mg Transdermal Patch Applied 02/28/14 1515)  sodium chloride 0.9 % bolus 1,000 mL (0 mLs Intravenous Stopped 02/28/14 1418)    Casey Duncan is a 33 y.o. female presenting with possible suicide attempt and acetaminophen overdose. Patient reports to me that she took 3 tabs generic dollars per headache medication at 4 AM. She reported to the nurse that she took 4 extra strength Tylenol 11 AM. Her ex-husband is in the ED and reports that a family member is not  present in the ED saw her take a bottle of Tylenol approximately 11 AM. Patient has normal liver function tests but her acetaminophen level is slightly elevated at 47. Discussed with poison control and the attending physician. Plan is to recheck acetaminophen level.  Patient adamantly denies suicide attempt, states that she was trying to manipulate her husband into reconciling with her. TTS consulted.  Patient has been evaluated by TTS: A pre-that she will be safe to discharge and is not a threat to herself. We'll recheck acetaminophen and liver function tests  Patient said acetaminophen level is trending down and well under the minimal concentration on the dramatic Matthew nomogram. Patient has no elevation in liver function tests on repeat evaluation. She consistently has maintained that she did not truly make a suicide attempts. Patient's husband is in the ED with her and will accompany her home.  Discussed case with attending MD who agrees with plan and stability to d/c to home.   Evaluation does not show pathology that would require ongoing emergent intervention or inpatient treatment. Pt is hemodynamically stable and mentating appropriately. Discussed findings and plan with patient/guardian, who agrees with care plan. All  questions answered. Return precautions discussed and outpatient follow up given.       Wynetta Emery, PA-C 02/28/14 [phone]

## 2014-02-28 NOTE — BH Assessment (Signed)
Assessment Note  Casey Duncan is an 33 y.o. female who presents to [facility] voluntarily with the chief complaint of depression and ingesting 10 tylenol pills after argument with her husband. Patient's husband was present for assessment and also provided collateral information. Patient reported that she and her husband have been having arguments for the past couple of weeks and that her husband threatened to leave her due to them being unable to resolve their issues. Patient reported that told her husband she ingested tylenol pills as a means "to get his attention" in hope that he would change his mind. Patient was observed to be tearful as she reported "I'm not suicidal and I was wrong for the way I went about this". Patient reported that she had previously left their residence and was sleeping in her car or staying with relatives during their discord.  Patient reports that she was trying to save her relationship by stating she had ingested the tylenol but now feels remorseful. Patient's husband reported that he and patient desire to pursue marital counseling with their pastor and that he does not have concerns for her safety. Patient's husband reported that patient is not a danger to herself and that she has support to assist her during this time. Patient denied any past suicidal ideations and has never received psychiatric inpatient treatment. Patient and her husband are receptive to outpatient information in regard to follow up for counseling. Patient denies SI/HI/AVH at this time and is able to contract for safety.    Axis I: Major Depression, single episode Axis II: Deferred Axis III:  Past Medical History  Diagnosis Date  . No pertinent past medical history   . Abnormal vaginal bleeding    Axis IV: other psychosocial or environmental problems, problems related to social environment and problems with primary support group Axis V: 61-70 mild symptoms  Past Medical  History:  Past Medical History  Diagnosis Date  . No pertinent past medical history   . Abnormal vaginal bleeding     Past Surgical History  Procedure Laterality Date  . No past surgeries      Family History: No family history on file.  Social History:  reports that she has been smoking.  She has never used smokeless tobacco. She reports that she uses illicit drugs (Marijuana). She reports that she does not drink alcohol.  Additional Social History:  Alcohol / Drug Use History of alcohol / drug use?: Yes Substance #1 Name of Substance 1: THC 1 - Age of First Use: 20s 1 - Amount (size/oz): varies 1 - Frequency: rarely 1 - Duration: years 1 - Last Use / Amount: Unspecified by patient  CIWA: CIWA-Ar BP: 107/80 mmHg Pulse Rate: 98 COWS:    Allergies: No Known Allergies  Home Medications:  (Not in a hospital admission)  OB/GYN Status:  Patient's last menstrual period was 02/07/2014.  General Assessment Data Location of Assessment: [facility] ED Is this a Tele or Face-to-Face Assessment?: Face-to-Face Is this an Initial Assessment or a Re-assessment for this encounter?: Initial Assessment Living Arrangements: Spouse/significant other Can pt return to current living arrangement?: Yes Admission Status: Voluntary Is patient capable of signing voluntary admission?: Yes Transfer from: Acute Hospital Referral Source: Self/Family/Friend     [facility] Crisis Care Plan Living Arrangements: Spouse/significant other Name of Psychiatrist: None Name of Therapist: None  Education Status Is patient currently in school?: No  Risk to self with the past 6 months Suicidal Ideation: No-Not Currently/Within Last 6 Months  Suicidal Intent: No-Not Currently/Within Last 6 Months Is patient at risk for suicide?: No Suicidal Plan?: No-Not Currently/Within Last 6 Months Access to Means: Yes Specify Access to Suicidal Means: Access to OTC medication What has been your use of drugs/alcohol within the  last 12 months?: THC Previous Attempts/Gestures: No How many times?: 0 Other Self Harm Risks: None Triggers for Past Attempts: None known Intentional Self Injurious Behavior: None Family Suicide History: No Recent stressful life event(s): Conflict (Comment) Persecutory voices/beliefs?: No Depression: Yes Depression Symptoms: Insomnia Substance abuse history and/or treatment for substance abuse?: No  Risk to Others within the past 6 months Homicidal Ideation: No Thoughts of Harm to Others: No Current Homicidal Intent: No Current Homicidal Plan: No Access to Homicidal Means: No Identified Victim: None History of harm to others?: No Assessment of Violence: None Noted Violent Behavior Description: Pt is calm and cooperative Does patient have access to weapons?: No Criminal Charges Pending?: No Does patient have a court date: No  Psychosis Hallucinations: None noted Delusions: None noted  Mental Status Report Appear/Hygiene: In hospital gown Eye Contact: Good Motor Activity: Freedom of movement Speech: Logical/coherent Level of Consciousness: Alert Mood: Anxious Affect: Anxious Anxiety Level: Minimal Thought Processes: Coherent;Relevant Judgement: Partial Orientation: Person;Place;Time;Situation  Cognitive Functioning Concentration: Normal Memory: Recent Intact;Remote Intact IQ: Average Insight: Good Impulse Control: Fair Appetite: Good Weight Loss: 0 Weight Gain: 0 Sleep: Decreased Total Hours of Sleep: 3 Vegetative Symptoms: None  ADLScreening [facility] Assessment Services) Patient's cognitive ability adequate to safely complete daily activities?: Yes Patient able to express need for assistance with ADLs?: Yes Independently performs ADLs?: Yes (appropriate for developmental age)  Prior Inpatient Therapy Prior Inpatient Therapy: No  Prior Outpatient Therapy Prior Outpatient Therapy: No  ADL Screening (condition at time of admission) Patient's cognitive  ability adequate to safely complete daily activities?: Yes Is the patient deaf or have difficulty hearing?: No Does the patient have difficulty seeing, even when wearing glasses/contacts?: No Does the patient have difficulty concentrating, remembering, or making decisions?: No Patient able to express need for assistance with ADLs?: Yes Does the patient have difficulty dressing or bathing?: No Independently performs ADLs?: Yes (appropriate for developmental age) Does the patient have difficulty walking or climbing stairs?: No Weakness of Legs: None Weakness of Arms/Hands: None  Home Assistive Devices/Equipment Home Assistive Devices/Equipment: None  Therapy Consults (therapy consults require a physician order) PT Evaluation Needed: No OT Evalulation Needed: No SLP Evaluation Needed: No Abuse/Neglect Assessment (Assessment to be complete while patient is alone) Physical Abuse: Denies Verbal Abuse: Denies Sexual Abuse: Denies Exploitation of patient/patient's resources: Denies Self-Neglect: Denies Values / Beliefs Cultural Requests During Hospitalization: None Spiritual Requests During Hospitalization: None Consults Spiritual Care Consult Needed: No Social Work Consult Needed: No Merchant [employer] officer (For Healthcare) Does patient have an advance directive?: No Would patient like information on creating an advanced directive?: No - patient declined information    Additional Information 1:1 In Past 12 Months?: No CIRT Risk: No Elopement Risk: No Does patient have medical clearance?: Yes     Disposition:  Disposition Initial Assessment Completed for this Encounter: Yes Disposition of Patient: Outpatient treatment Type of outpatient treatment: Adult  On Site Evaluation by:   Reviewed with Physician:    Janann Colonel C 02/28/2014 3:26 PM

## 2014-03-01 NOTE — ED Provider Notes (Signed)
Medical screening examination/treatment/procedure(s) were conducted as a shared visit with non-physician practitioner(s) and myself.  I personally evaluated the patient during the encounter.   EKG Interpretation   Date/Time:  Sunday February 28 2014 12:14:07 EDT Ventricular Rate:  111 PR Interval:  147 QRS Duration: 93 QT Interval:  354 QTC Calculation: 481 R Axis:   73 Text Interpretation:  Sinus tachycardia Borderline repolarization  abnormality Borderline prolonged QT interval Nonspecific T wave  abnormality No previous tracing Confirmed by Denton LankSTEINL  MD, Caryn BeeKEVIN ([id number]) on  02/28/2014 12:20:31 PM      Pt states at approximately 11 am today had taken 4 tylenol, and told ex husband she was having thoughts of suicide - pt states she said so as ploy to 'get him back/reunite with him'.  She states she never imagined ems/law enforcement would be notified, stating she definitely did not want to harm or kill self.   Pt w normal mood/affect. Alert, pleasant, smiling, conversant.   Labs.   4 hr acetaminophen level not in toxic range.   Psych team consulted.   Suzi RootsKevin E Alida Greiner, MD 03/01/14 [phone]

## 2014-03-02 LAB — URINE CULTURE: Colony Count: 50000

## 2014-03-29 ENCOUNTER — Encounter (HOSPITAL_COMMUNITY): Payer: Self-pay | Admitting: Emergency Medicine

## 2014-12-28 ENCOUNTER — Encounter: Payer: Self-pay | Admitting: Gastroenterology

## 2015-03-12 ENCOUNTER — Emergency Department (HOSPITAL_COMMUNITY)
Admission: EM | Admit: 2015-03-12 | Discharge: 2015-03-12 | Disposition: A | Discharge status: Home / Self Care | Payer: Medicaid Other | Attending: Emergency Medicine | Admitting: Emergency Medicine

## 2015-03-12 ENCOUNTER — Encounter (HOSPITAL_COMMUNITY): Payer: Self-pay

## 2015-03-12 ENCOUNTER — Emergency Department (HOSPITAL_COMMUNITY): Payer: Medicaid Other

## 2015-03-12 DIAGNOSIS — Z720 Tobacco use: Secondary | ICD-10-CM | POA: Insufficient documentation

## 2015-03-12 DIAGNOSIS — S6391XA Sprain of unspecified part of right wrist and hand, initial encounter: Secondary | ICD-10-CM | POA: Insufficient documentation

## 2015-03-12 DIAGNOSIS — Y9389 Activity, other specified: Secondary | ICD-10-CM | POA: Insufficient documentation

## 2015-03-12 DIAGNOSIS — Z7982 Long term (current) use of aspirin: Secondary | ICD-10-CM | POA: Insufficient documentation

## 2015-03-12 DIAGNOSIS — Y998 Other external cause status: Secondary | ICD-10-CM | POA: Insufficient documentation

## 2015-03-12 DIAGNOSIS — Y92410 Unspecified street and highway as the place of occurrence of the external cause: Secondary | ICD-10-CM | POA: Insufficient documentation

## 2015-03-12 DIAGNOSIS — S3992XA Unspecified injury of lower back, initial encounter: Secondary | ICD-10-CM | POA: Insufficient documentation

## 2015-03-12 DIAGNOSIS — S161XXA Strain of muscle, fascia and tendon at neck level, initial encounter: Secondary | ICD-10-CM

## 2015-03-12 MED ORDER — HYDROCODONE-ACETAMINOPHEN 5-325 MG PO TABS
2.0000 | ORAL_TABLET | Freq: Once | ORAL | Status: AC
  Administered 2015-03-12: 2 via ORAL
  Filled 2015-03-12: qty 2

## 2015-03-12 MED ORDER — HYDROCODONE-ACETAMINOPHEN 5-325 MG PO TABS: 1.0000 | ORAL_TABLET | Freq: Four times a day (QID) | ORAL | Status: DC | PRN
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
## 2016-01-05 ENCOUNTER — Encounter (HOSPITAL_COMMUNITY): Payer: Self-pay | Admitting: Emergency Medicine

## 2016-01-05 ENCOUNTER — Emergency Department (HOSPITAL_COMMUNITY)
Admission: EM | Admit: 2016-01-05 | Discharge: 2016-01-05 | Disposition: A | Discharge status: Home / Self Care | Payer: Medicaid Other | Attending: Emergency Medicine | Admitting: Emergency Medicine

## 2016-01-05 DIAGNOSIS — R109 Unspecified abdominal pain: Secondary | ICD-10-CM

## 2016-01-05 DIAGNOSIS — Z792 Long term (current) use of antibiotics: Secondary | ICD-10-CM | POA: Insufficient documentation

## 2016-01-05 DIAGNOSIS — Z79899 Other long term (current) drug therapy: Secondary | ICD-10-CM | POA: Diagnosis not present

## 2016-01-05 DIAGNOSIS — T81517A Adhesions due to foreign body accidentally left in body following removal of catheter or packing, initial encounter: Secondary | ICD-10-CM | POA: Diagnosis not present

## 2016-01-05 DIAGNOSIS — F1290 Cannabis use, unspecified, uncomplicated: Secondary | ICD-10-CM | POA: Diagnosis not present

## 2016-01-05 DIAGNOSIS — R1031 Right lower quadrant pain: Secondary | ICD-10-CM | POA: Insufficient documentation

## 2016-01-05 DIAGNOSIS — F17200 Nicotine dependence, unspecified, uncomplicated: Secondary | ICD-10-CM | POA: Diagnosis not present

## 2016-01-05 DIAGNOSIS — Y738 Miscellaneous gastroenterology and urology devices associated with adverse incidents, not elsewhere classified: Secondary | ICD-10-CM | POA: Diagnosis not present

## 2016-01-05 MED ORDER — BACITRACIN ZINC 500 UNIT/GM EX OINT
TOPICAL_OINTMENT | CUTANEOUS | Status: DC
  Filled 2016-01-05: qty 0.9

## 2016-01-05 NOTE — ED Provider Notes (Signed)
[facility]-EMERGENCY DEPT Provider Note   CSN: [id number] Arrival date & time: 01/05/16  2150  First Provider Contact:  First MD Initiated Contact with Patient 01/05/16 2313        History   Chief Complaint Chief Complaint  Patient presents with  . Abdominal Pain    HPI Casey Duncan is a 35 y.o. female.  Presents on POD #7 from [place]os[place] in [place]. Presents with abdominal wall drain in place. Reports she was told to remove this on day #7 by her plastic surgeon. She could not remove this at home. Requesting removal in ER. No other significant complaints.    The history is provided by the patient.    Past Medical History:  Diagnosis Date  . Abnormal vaginal bleeding   . No pertinent past medical history     There are no active problems to display for this patient.   Past Surgical History:  Procedure Laterality Date  . NO PAST SURGERIES      OB History    Gravida Para Term Preterm AB Living   3 3 3    0 2   SAB TAB Ectopic Multiple Live Births                   Home Medications    Prior to Admission medications   Medication Sig Start Date End Date Taking? Authorizing Provider  acetaminophen (TYLENOL) 500 MG tablet Take 1,000 mg by mouth every 4 (four) hours as needed for mild pain.    Yes Historical Provider, MD  cephALEXin (KEFLEX) 500 MG capsule Take 1 capsule by mouth 2 (two) times daily. 12/29/15  Yes Historical Provider, MD  diphenhydrAMINE (BENADRYL) 25 mg capsule Take 25 mg by mouth every 6 (six) hours as needed for itching.   Yes Historical Provider, MD  ondansetron (ZOFRAN) 4 MG tablet Take 4 mg by mouth every 8 (eight) hours as needed. 12/29/15  Yes Historical Provider, MD  oxyCODONE-acetaminophen (PERCOCET/ROXICET) 5-325 MG tablet Take 1-2 tablets by mouth every 4 (four) hours as needed. for pain 12/29/15  Yes Historical Provider, MD  HYDROcodone-acetaminophen (NORCO/VICODIN) 5-325 MG tablet Take 1-2 tablets by mouth every 6 (six) hours as  needed. Patient not taking: Reported on 01/05/2016 03/12/15   Roxy Horsemanobert Browning, PA-C    Family History No family history on file.  Social History Social History  Substance Use Topics  . Smoking status: Current Every Day Smoker    Packs/day: 1.00  . Smokeless tobacco: Never Used  . Alcohol use No     Comment: Patient denies     Allergies   Review of patient's allergies indicates no known allergies.   Review of Systems Review of Systems  All other systems reviewed and are negative.    Physical Exam Updated Vital Signs BP 124/98   Pulse (!) 139   Temp 98.1 F (36.7 C)   Resp 18   SpO2 100%   Physical Exam  Constitutional: She is oriented to person, place, and time. She appears well-developed and well-nourished.  HENT:  Head: Normocephalic.  Eyes: EOM are normal.  Neck: Normal range of motion.  Pulmonary/Chest: Effort normal.  Abdominal:  Surgical drain in RLQ without surrounding signs of infection  Musculoskeletal: Normal range of motion.  Neurological: She is alert and oriented to person, place, and time.  Psychiatric: She has a normal mood and affect.  Nursing note and vitals reviewed.    ED Treatments / Results  Labs (all labs ordered are listed, but  only abnormal results are displayed) Labs Reviewed - No data to display  EKG  EKG Interpretation None       Radiology No results found.  Procedures .Foreign Body Removal Date/Time: 01/05/2016 12:10 AM Performed by: Azalia Bilis Authorized by: Azalia Bilis  Consent: Verbal consent obtained. Risks and benefits: risks, benefits and alternatives were discussed Consent given by: patient Intake: abdominal wall. Complexity: simple 1 objects recovered. Objects recovered: surgical drain Post-procedure assessment: foreign body removed Patient tolerance: Patient tolerated the procedure well with no immediate complications   (including critical care time)  Medications Ordered in ED Medications - No  data to display   Initial Impression / Assessment and Plan / ED Course  I have reviewed the triage vital signs and the nursing notes.  Pertinent labs & imaging results that were available during my care of the patient were reviewed by me and considered in my medical decision making (see chart for details).  Clinical Course    Drain removed from abdominal wall. No other complaints.   Final Clinical Impressions(s) / ED Diagnoses   Final diagnoses:  Abdominal pain, unspecified abdominal location  Adhesions due to foreign body accidentally left in body following removal of catheter or packing, initial encounter    New Prescriptions New Prescriptions   No medications on file     Azalia Bilis, MD 01/06/16 0011

## 2016-01-05 NOTE — ED Triage Notes (Addendum)
Patient had liposuction x1 week ago. Patient reports she was suppose to remove her drain today, but feels as if wound is healing around drain, causing increasing pain. Reports drain has been draining less than normal x2 days. Denies s/s infection. No fever, N/V.

## 2016-08-05 ENCOUNTER — Emergency Department (HOSPITAL_COMMUNITY): Payer: Medicaid Other

## 2016-08-05 ENCOUNTER — Emergency Department (HOSPITAL_COMMUNITY)
Admission: EM | Admit: 2016-08-05 | Discharge: 2016-08-05 | Disposition: A | Discharge status: Home / Self Care | Payer: Medicaid Other | Attending: Emergency Medicine | Admitting: Emergency Medicine

## 2016-08-05 ENCOUNTER — Encounter (HOSPITAL_COMMUNITY): Payer: Self-pay

## 2016-08-05 DIAGNOSIS — S161XXA Strain of muscle, fascia and tendon at neck level, initial encounter: Secondary | ICD-10-CM | POA: Diagnosis not present

## 2016-08-05 DIAGNOSIS — Y939 Activity, unspecified: Secondary | ICD-10-CM | POA: Diagnosis not present

## 2016-08-05 DIAGNOSIS — F17200 Nicotine dependence, unspecified, uncomplicated: Secondary | ICD-10-CM | POA: Insufficient documentation

## 2016-08-05 DIAGNOSIS — S39012A Strain of muscle, fascia and tendon of lower back, initial encounter: Secondary | ICD-10-CM

## 2016-08-05 DIAGNOSIS — Y999 Unspecified external cause status: Secondary | ICD-10-CM | POA: Insufficient documentation

## 2016-08-05 DIAGNOSIS — Y92410 Unspecified street and highway as the place of occurrence of the external cause: Secondary | ICD-10-CM | POA: Insufficient documentation

## 2016-08-05 DIAGNOSIS — M25512 Pain in left shoulder: Secondary | ICD-10-CM | POA: Diagnosis not present

## 2016-08-05 DIAGNOSIS — S199XXA Unspecified injury of neck, initial encounter: Secondary | ICD-10-CM | POA: Diagnosis present

## 2016-08-05 MED ORDER — METHOCARBAMOL 500 MG PO TABS: 500.0000 mg | ORAL_TABLET | Freq: Two times a day (BID) | ORAL | 0 refills | Status: DC
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
## 2016-08-05 NOTE — ED Triage Notes (Signed)
PT at staff desk and stated" If I don't get something to eat I am leaving."  Pt was informed the Provider needed to see her first. Pt appeared agitated and reported I don't have time for this. Pt returned to her room
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
## 2016-08-05 NOTE — Discharge Instructions (Signed)
All of your imaging is normal. This is likely muscular skeletal pain. Soak in a warm bath absence all. Motrin Tylenol for pain and will give her muscle relaxers. Follow with orthopedist if symptoms are not improving return if symptoms worsen.

## 2016-08-05 NOTE — ED Notes (Signed)
Declined W/C at D/C and was escorted to lobby by RN. 

## 2016-11-03 ENCOUNTER — Emergency Department (HOSPITAL_COMMUNITY)
Admission: EM | Admit: 2016-11-03 | Discharge: 2016-11-03 | Disposition: A | Discharge status: Home / Self Care | Payer: Medicaid Other | Attending: Emergency Medicine | Admitting: Emergency Medicine

## 2016-11-03 ENCOUNTER — Encounter (HOSPITAL_COMMUNITY): Payer: Self-pay | Admitting: *Deleted

## 2016-11-03 DIAGNOSIS — L02214 Cutaneous abscess of groin: Secondary | ICD-10-CM | POA: Insufficient documentation

## 2016-11-03 DIAGNOSIS — F17210 Nicotine dependence, cigarettes, uncomplicated: Secondary | ICD-10-CM | POA: Diagnosis not present

## 2016-11-03 MED ORDER — CLINDAMYCIN HCL 150 MG PO CAPS
300.0000 mg | ORAL_CAPSULE | Freq: Once | ORAL | Status: AC
  Administered 2016-11-03: 300 mg via ORAL
  Filled 2016-11-03: qty 2

## 2016-11-03 MED ORDER — CLINDAMYCIN HCL 300 MG PO CAPS: 300.0000 mg | ORAL_CAPSULE | Freq: Four times a day (QID) | ORAL | 0 refills | Status: AC

## 2016-11-03 NOTE — ED Notes (Signed)
Pt verbalized understanding discharge instructions and denies any further needs or questions at this time. VS stable, ambulatory and steady gait.   

## 2016-11-03 NOTE — ED Notes (Signed)
Care handoff to Casey RN 

## 2016-11-03 NOTE — ED Notes (Signed)
PT taken 2 warm blankets.

## 2016-11-03 NOTE — ED Triage Notes (Signed)
Pt reports having abscess to right groin for over a month, has been on po antibiotics with minimal relief. Denies fever.

## 2016-11-03 NOTE — Discharge Instructions (Signed)
Please schedule an appointment with your primary care provider for a recheck in 3-5 days. The antibiotic you have been prescribed Clindamycin. Please take this medication 4 times per day for the next 10 days. If you develop new or worsening symptoms, please return to the Emergency Department for new or worsening symptoms, including fever, chills, or if the area becomes red, hot, or more swollen. Please avoid shaving or waxing the area to avoid recurrence of infection.

## 2016-11-03 NOTE — ED Notes (Signed)
Called to room by pt yelling. On entering room, pt has removed equipment and bp cuff and cords are on floor. Pt states she has been here for 3 hours and was told she would be next. Pt states "This is [facility]." Pt becomes tearful and request the charge nurse. Charge nurse contacted. And informed of pt request but she is currently in critical event and pt informed of same.

## 2016-11-03 NOTE — ED Provider Notes (Signed)
[facility]-EMERGENCY DEPT Provider Note   CSN: [id number] Arrival date & time: 11/03/16  1141     History   Chief Complaint Chief Complaint  Patient presents with  . Abscess    HPI Casey Duncan is a 36 y.o. female who presents to the emergency department for an abscess to the right groin.   She reports that she was initially seen by her PCP on 4/27 for an abscess that started after she underwent a bikini wax. She was started on Keflex. She followed up with her PCP on 5/17 because the area was not improving so her antibiotics were changed to Doxycycline for 10 days. She completed the course and followed up with her PCP on 5/22 because the abscess was not resolved and was given another 10 day course of doxycycline.   She reports that she completed this course at the beginning of June and report the abscess completely resolved. After resolution, she states that she underwent a sugar body wax, and a new abscess developed. No treatment for the new abscess PTA. NKA. She denies fever, chills, rectal pain, or other symptoms at this time.    The history is provided by the patient. No language interpreter was used.    Past Medical History:  Diagnosis Date  . Abnormal vaginal bleeding   . No pertinent past medical history     There are no active problems to display for this patient.   Past Surgical History:  Procedure Laterality Date  . NO PAST SURGERIES      OB History    Gravida Para Term Preterm AB Living   3 3 3    0 2   SAB TAB Ectopic Multiple Live Births                   Home Medications    Prior to Admission medications   Medication Sig Start Date End Date Taking? Authorizing Provider  doxycycline (VIBRA-TABS) 100 MG tablet Take 100 mg by mouth 2 (two) times daily. 10 days supply   Yes [provider]  acetaminophen (TYLENOL) 500 MG tablet Take 1,000 mg by mouth every 4 (four) hours as needed for mild pain.     [provider]  cephALEXin (KEFLEX) 500  MG capsule Take 1 capsule by mouth 2 (two) times daily. 12/29/15   [provider]  clindamycin (CLEOCIN) 300 MG capsule Take 1 capsule (300 mg total) by mouth 4 (four) times daily. 11/03/16 11/13/16  McDonald, Mia A, PA-C  diphenhydrAMINE (BENADRYL) 25 mg capsule Take 25 mg by mouth every 6 (six) hours as needed for itching.    [provider]  HYDROcodone-acetaminophen (NORCO/VICODIN) 5-325 MG tablet Take 1-2 tablets by mouth every 6 (six) hours as needed. Patient not taking: Reported on 01/05/2016 03/12/15   Roxy HorsemanBrowning, Robert, PA-C  methocarbamol (ROBAXIN) 500 MG tablet Take 1 tablet (500 mg total) by mouth 2 (two) times daily. 08/05/16   Demetrios LollLeaphart, Kenneth T, PA-C  ondansetron (ZOFRAN) 4 MG tablet Take 4 mg by mouth every 8 (eight) hours as needed. 12/29/15   [provider]  oxyCODONE-acetaminophen (PERCOCET/ROXICET) 5-325 MG tablet Take 1-2 tablets by mouth every 4 (four) hours as needed. for pain 12/29/15   [provider]    Family History History reviewed. No pertinent family history.  Social History Social History  Substance Use Topics  . Smoking status: Current Every Day Smoker    Packs/day: 1.00  . Smokeless tobacco: Never Used  . Alcohol use No  Comment: Patient denies     Allergies   Patient has no known allergies.   Review of Systems Review of Systems  Constitutional: Negative for activity change, chills and fever.  Respiratory: Negative for shortness of breath.   Cardiovascular: Negative for chest pain.  Gastrointestinal: Negative for abdominal pain.  Musculoskeletal: Negative for back pain and gait problem.  Skin: Positive for wound. Negative for rash.   Physical Exam Updated Vital Signs BP 112/80   Pulse 75   Temp 98.7 F (37.1 C) (Oral)   Resp 16   LMP 10/15/2016   SpO2 100%   Physical Exam  Constitutional: No distress.  HENT:  Head: Normocephalic.  Eyes: Conjunctivae are normal.  Neck: Neck supple.  Cardiovascular:  Normal rate and regular rhythm.  Exam reveals no gallop and no friction rub.   No murmur heard. Pulmonary/Chest: Effort normal. No respiratory distress.  Abdominal: Soft. Bowel sounds are normal. She exhibits no distension. There is no tenderness.  Neurological: She is alert.  Skin: Skin is warm. No rash noted.  Thre is a 2x2 cm abscess to the right groin with moderate induration. No obvious drainage. Minimal fluctuance. No warmth or swelling to the surrounding skin; minimal redness.   Psychiatric: Her behavior is normal.  Nursing note and vitals reviewed.    ED Treatments / Results  Labs (all labs ordered are listed, but only abnormal results are displayed) Labs Reviewed - No data to display  EKG  EKG Interpretation None       Radiology No results found.  Procedures Procedures (including critical care time)  EMERGENCY DEPARTMENT US SOFT TISSUE INTERPRETATION "Study: Limited Soft Tissue Ultrasound"  INDICATIONS: Soft tissue infection Multiple views of the body part were obtained in real-time with a multi-frequency linear probe  PERFORMED BY: Myself IMAGES ARCHIVED?: Yes SIDE:Right  BODY PART:right groin INTERPRETATION:  Cellulitis present  Medications Ordered in ED Medications  clindamycin (CLEOCIN) capsule 300 mg (300 mg Oral Given 11/03/16 1521)     Initial Impression / Assessment and Plan / ED Course  I have reviewed the triage vital signs and the nursing notes.  Pertinent labs & imaging results that were available during my care of the patient were reviewed by me and considered in my medical decision making (see chart for details).     Patient with recurrent abscess. Moderate induration, but minimal fluctuance noted on PE. Not amenable to incision and drainage.  Bedside US with minimal fluid collection. Mild signs of cellulitis to surrounding skin. Will d/c to home with Clindamycin and PCP follow up. Recommended avoiding shaving or waxing at this time. VSS.  NAD. The patient is stable for discharge.   Final Clinical Impressions(s) / ED Diagnoses   Final diagnoses:  Abscess of right groin    New Prescriptions Discharge Medication List as of 11/03/2016  3:07 PM    START taking these medications   Details  clindamycin (CLEOCIN) 300 MG capsule Take 1 capsule (300 mg total) by mouth 4 (four) times daily., Starting Sat 11/03/2016, Until Tue 11/13/2016, Print         McDonald, Mia A, PA-C 11/03/16 1745    Mesner, Barbara Cower, MD 11/04/16 [phone]

## 2016-11-03 NOTE — ED Notes (Addendum)
PT enters room and states, "Is it going to be another hour and a half before I'm seen?" This nurse reports that that is unlikely. PT states, "Well, will they be in here in a minute?" This nurse replies that providers are rounding. PT requests warm blankets. PT placed on BP and O2 monitor at this time. PT reports she took a course of doxycycline a few weeks ago, it did not improve. PT was started on a second course of antibiotics and "it shrunk a lot" but PT did not finish the second course. Now abscess is "three times bigger than it was"

## 2017-06-29 ENCOUNTER — Emergency Department (HOSPITAL_COMMUNITY)
Admission: EM | Admit: 2017-06-29 | Discharge: 2017-06-29 | Disposition: A | Discharge status: Home / Self Care | Payer: Medicaid Other | Attending: Emergency Medicine | Admitting: Emergency Medicine

## 2017-06-29 ENCOUNTER — Other Ambulatory Visit: Payer: Self-pay

## 2017-06-29 ENCOUNTER — Encounter (HOSPITAL_COMMUNITY): Payer: Self-pay | Admitting: *Deleted

## 2017-06-29 DIAGNOSIS — Z79899 Other long term (current) drug therapy: Secondary | ICD-10-CM | POA: Insufficient documentation

## 2017-06-29 DIAGNOSIS — F17210 Nicotine dependence, cigarettes, uncomplicated: Secondary | ICD-10-CM | POA: Insufficient documentation

## 2017-06-29 DIAGNOSIS — Z711 Person with feared health complaint in whom no diagnosis is made: Secondary | ICD-10-CM

## 2017-06-29 DIAGNOSIS — Z113 Encounter for screening for infections with a predominantly sexual mode of transmission: Secondary | ICD-10-CM | POA: Insufficient documentation

## 2017-06-29 LAB — WET PREP, GENITAL
Clue Cells Wet Prep HPF POC: NONE SEEN
SPERM: NONE SEEN
Trich, Wet Prep: NONE SEEN
YEAST WET PREP: NONE SEEN

## 2017-06-29 LAB — URINALYSIS, ROUTINE W REFLEX MICROSCOPIC
BILIRUBIN URINE: NEGATIVE
Glucose, UA: NEGATIVE mg/dL
Ketones, ur: NEGATIVE mg/dL
Leukocytes, UA: NEGATIVE
Nitrite: NEGATIVE
Protein, ur: NEGATIVE mg/dL
Specific Gravity, Urine: 1.024 (ref 1.005–1.030)
pH: 5 (ref 5.0–8.0)

## 2017-06-29 LAB — POC URINE PREG, ED: PREG TEST UR: NEGATIVE

## 2017-06-29 NOTE — ED Notes (Signed)
Wet prep and gc/chlamydia collected by patient via vaginal swab.

## 2017-06-29 NOTE — ED Provider Notes (Signed)
[facility] EMERGENC[facility] Provider Note   CSN: [id number] Arrival date & time: 06/29/17  1050     History   Chief Complaint Chief Complaint  Patient presents with  . Exposure to STD    HPI Casey Duncan is a 37 y.o. female with a PMHx of abnormal vaginal bleeding, who presents to the ED with complaints of requesting STD check.  She has absolutely no symptoms whatsoever, she just wants to be checked.  She is sexually active with one female partner, unprotected.  She also reports that she recently had vaginal rejuvenation surgery so she does not want a pelvic exam, she wants to have everything done on her urine sample.  She denies fevers, chills, CP, SOB, abd pain, N/V/D/C, hematuria, dysuria, vaginal bleeding/discharge, vaginal itching, genital sores, myalgias, arthralgias, numbness, tingling, focal weakness, or any other complaints at this time.    The history is provided by the patient and medical records. No language interpreter was used.    Past Medical History:  Diagnosis Date  . Abnormal vaginal bleeding   . No pertinent past medical history     There are no active problems to display for this patient.   Past Surgical History:  Procedure Laterality Date  . NO PAST SURGERIES      OB History    Gravida Para Term Preterm AB Living   3 3 3    0 2   SAB TAB Ectopic Multiple Live Births                   Home Medications    Prior to Admission medications   Medication Sig Start Date End Date Taking? Authorizing Provider  acetaminophen (TYLENOL) 500 MG tablet Take 1,000 mg by mouth every 4 (four) hours as needed for mild pain.     [provider]  cephALEXin (KEFLEX) 500 MG capsule Take 1 capsule by mouth 2 (two) times daily. 12/29/15   [provider]  diphenhydrAMINE (BENADRYL) 25 mg capsule Take 25 mg by mouth every 6 (six) hours as needed for itching.    [provider]  doxycycline (VIBRA-TABS) 100 MG tablet Take  100 mg by mouth 2 (two) times daily. 10 days supply    [provider]  HYDROcodone-acetaminophen (NORCO/VICODIN) 5-325 MG tablet Take 1-2 tablets by mouth every 6 (six) hours as needed. Patient not taking: Reported on 01/05/2016 03/12/15   Roxy HorsemanBrowning, Robert, PA-C  methocarbamol (ROBAXIN) 500 MG tablet Take 1 tablet (500 mg total) by mouth 2 (two) times daily. 08/05/16   Demetrios LollLeaphart, Kenneth T, PA-C  ondansetron (ZOFRAN) 4 MG tablet Take 4 mg by mouth every 8 (eight) hours as needed. 12/29/15   [provider]  oxyCODONE-acetaminophen (PERCOCET/ROXICET) 5-325 MG tablet Take 1-2 tablets by mouth every 4 (four) hours as needed. for pain 12/29/15   [provider]    Family History No family history on file.  Social History Social History   Tobacco Use  . Smoking status: Current Every Day Smoker    Packs/day: 1.00  . Smokeless tobacco: Never Used  Substance Use Topics  . Alcohol use: No    Comment: Patient denies  . Drug use: Yes    Types: Marijuana    Comment: Patient reports she occasionally uses THC     Allergies   Patient has no known allergies.   Review of Systems Review of Systems  Constitutional: Negative for chills and fever.  Respiratory: Negative for shortness of breath.  Cardiovascular: Negative for chest pain.  Gastrointestinal: Negative for abdominal pain, constipation, diarrhea, nausea and vomiting.  Genitourinary: Negative for dysuria, genital sores, hematuria, vaginal bleeding, vaginal discharge and vaginal pain.  Musculoskeletal: Negative for arthralgias and myalgias.  Skin: Negative for color change.  Allergic/Immunologic: Negative for immunocompromised state.  Neurological: Negative for weakness and numbness.  Psychiatric/Behavioral: Negative for confusion.   All other systems reviewed and are negative for acute change except as noted in the HPI.    Physical Exam Updated Vital Signs BP 124/83 (BP Location: Right Arm)   Pulse 92    Temp 98.7 F (37.1 C) (Oral)   Resp 20   SpO2 100%   Physical Exam  Constitutional: She is oriented to person, place, and time. Vital signs are normal. She appears well-developed and well-nourished.  Non-toxic appearance. No distress.  Afebrile, nontoxic, NAD  HENT:  Head: Normocephalic and atraumatic.  Mouth/Throat: Oropharynx is clear and moist and mucous membranes are normal.  Eyes: Conjunctivae and EOM are normal. Right eye exhibits no discharge. Left eye exhibits no discharge.  Neck: Normal range of motion. Neck supple.  Cardiovascular: Normal rate, regular rhythm, normal heart sounds and intact distal pulses. Exam reveals no gallop and no friction rub.  No murmur heard. Pulmonary/Chest: Effort normal and breath sounds normal. No respiratory distress. She has no decreased breath sounds. She has no wheezes. She has no rhonchi. She has no rales.  Abdominal: Soft. Normal appearance and bowel sounds are normal. She exhibits no distension. There is no tenderness. There is no rigidity, no rebound, no guarding, no CVA tenderness, no tenderness at McBurney's point and negative Murphy's sign.  Soft, NTND, +BS throughout, no r/g/r, neg murphy's, neg mcburney's, no CVA TTP   Genitourinary:  Genitourinary Comments: Pt declined  Musculoskeletal: Normal range of motion.  Neurological: She is alert and oriented to person, place, and time. She has normal strength. No sensory deficit.  Skin: Skin is warm, dry and intact. No rash noted.  Psychiatric: She has a normal mood and affect.  Nursing note and vitals reviewed.    ED Treatments / Results  Labs (all labs ordered are listed, but only abnormal results are displayed) Labs Reviewed  WET PREP, GENITAL - Abnormal; Notable for the following components:      Result Value   WBC, Wet Prep HPF POC FEW (*)    All other components within normal limits  URINALYSIS, ROUTINE W REFLEX MICROSCOPIC - Abnormal; Notable for the following components:    APPearance HAZY (*)    Hgb urine dipstick MODERATE (*)    Bacteria, UA RARE (*)    Squamous Epithelial / LPF 0-5 (*)    All other components within normal limits  RPR  HIV ANTIBODY (ROUTINE TESTING)  POC URINE PREG, ED  GC/CHLAMYDIA PROBE AMP ([place]) NOT AT [facility]    EKG  EKG Interpretation None       Radiology No results found.  Procedures Procedures (including critical care time)  Medications Ordered in ED Medications - No data to display   Initial Impression / Assessment and Plan / ED Course  I have reviewed the triage vital signs and the nursing notes.  Pertinent labs & imaging results that were available during my care of the patient were reviewed by me and considered in my medical decision making (see chart for details).     37 y.o. female here requesting STD check, but declines vaginal exam due to recent vaginal rejuvenation surgery. Has no symptoms. No  abdominal tenderness on exam. Will proceed with blind vaginal swab for wet prep, and perform GC/CT testing on the urine sample. Upreg neg, U/A without evidence of UTI. Will reassess once wet prep is done.   2:10 PM Wet prep with few WBCs but otherwise completely unremarkable. Doubt need for empiric treatment of any STDs given lack of symptoms. Discussed abstinence until test results return. F/up with health dept for future STD concerns. Safe sex encouraged, and discussed having partners tested and treated before re-engaging in intercourse. I explained the diagnosis and have given explicit precautions to return to the ER including for any other new or worsening symptoms. The patient understands and accepts the medical plan as it's been dictated and I have answered their questions. Discharge instructions concerning home care and prescriptions have been given. The patient is STABLE and is discharged to home in good condition.       Final Clinical Impressions(s) / ED Diagnoses   Final diagnoses:  Screen for STD  (sexually transmitted disease)  Concern about STD in female without diagnosis    ED Discharge Orders    [address] [place], [place] 06/29/17 1410    Doug Sou, MD 06/29/17 [phone]

## 2017-06-29 NOTE — ED Notes (Signed)
This EMT rounded on pt in lobby. Pt denied pain.

## 2017-06-29 NOTE — ED Triage Notes (Signed)
To ED requesting a STD check via urine for possible exposure. Denies symptoms.

## 2017-06-29 NOTE — Discharge Instructions (Signed)
You have been tested for gonorrhea, chlamydia, HIV, and Syphilis, and the hospital will call you if the lab is positive. You were checked for trichomonas, yeast infection, and bacterial vaginosis, and those results were negative. DO NOT ENGAGE IN SEXUAL ACTIVITY UNTIL YOU FIND OUT ABOUT YOUR RESULTS AND HAVE PARTNERS TESTED AND TREATED. ALL PARTNERS MUST BE TESTED AND TREATED FOR STD'S. ALWAYS USE CONDOMS WHEN ENGAGING IN INTERCOURSE. [facility] for future STD concerns or screenings. This is the recommendation by the CDC for people with multiple sexual partners or history of STDs. Follow up with the health department for future STD concerns, treatment, testing, etc. [facility] (called the [facility]) for any changes or worsening symptoms.

## 2017-06-30 LAB — RPR: RPR Ser Ql: NONREACTIVE

## 2017-06-30 LAB — HIV ANTIBODY (ROUTINE TESTING W REFLEX): HIV Screen 4th Generation wRfx: NONREACTIVE

## 2017-07-01 LAB — GC/CHLAMYDIA PROBE AMP (~~LOC~~) NOT AT ARMC
CHLAMYDIA, DNA PROBE: NEGATIVE
Neisseria Gonorrhea: NEGATIVE

## 2017-07-09 ENCOUNTER — Encounter (HOSPITAL_COMMUNITY): Payer: Self-pay | Admitting: Emergency Medicine

## 2017-07-09 ENCOUNTER — Other Ambulatory Visit: Payer: Self-pay

## 2017-07-09 ENCOUNTER — Emergency Department (HOSPITAL_COMMUNITY)
Admission: EM | Admit: 2017-07-09 | Discharge: 2017-07-09 | Disposition: A | Discharge status: Home / Self Care | Payer: Medicaid Other | Attending: Emergency Medicine | Admitting: Emergency Medicine

## 2017-07-09 DIAGNOSIS — Z79899 Other long term (current) drug therapy: Secondary | ICD-10-CM | POA: Diagnosis not present

## 2017-07-09 DIAGNOSIS — R21 Rash and other nonspecific skin eruption: Secondary | ICD-10-CM | POA: Diagnosis present

## 2017-07-09 DIAGNOSIS — T7840XA Allergy, unspecified, initial encounter: Secondary | ICD-10-CM | POA: Diagnosis not present

## 2017-07-09 DIAGNOSIS — F17200 Nicotine dependence, unspecified, uncomplicated: Secondary | ICD-10-CM | POA: Insufficient documentation

## 2017-07-09 MED ORDER — DIPHENHYDRAMINE HCL 25 MG PO TABS: 25.0000 mg | ORAL_TABLET | Freq: Four times a day (QID) | ORAL | 0 refills | Status: DC

## 2017-07-09 MED ORDER — PREDNISONE 20 MG PO TABS: ORAL_TABLET | ORAL | 0 refills | Status: DC

## 2017-07-09 MED ORDER — PREDNISONE 20 MG PO TABS
60.0000 mg | ORAL_TABLET | Freq: Once | ORAL | Status: AC
  Administered 2017-07-09: 60 mg via ORAL
  Filled 2017-07-09: qty 3

## 2017-07-09 MED ORDER — METHYLPREDNISOLONE SODIUM SUCC 125 MG IJ SOLR: 125.0000 mg | Freq: Once | INTRAMUSCULAR | Status: DC

## 2017-07-09 MED ORDER — RANITIDINE HCL 150 MG/10ML PO SYRP
150.0000 mg | ORAL_SOLUTION | Freq: Once | ORAL | Status: AC
  Administered 2017-07-09: 150 mg via ORAL
  Filled 2017-07-09: qty 10

## 2017-07-09 MED ORDER — FAMOTIDINE 20 MG PO TABS: 20.0000 mg | ORAL_TABLET | Freq: Two times a day (BID) | ORAL | 0 refills | Status: DC

## 2017-07-09 MED ORDER — DIPHENHYDRAMINE HCL 25 MG PO CAPS
25.0000 mg | ORAL_CAPSULE | Freq: Once | ORAL | Status: AC
  Administered 2017-07-09: 25 mg via ORAL
  Filled 2017-07-09: qty 1

## 2017-07-09 NOTE — ED Provider Notes (Signed)
[facility] EMERGENCY DEPARTMENT Provider Note   CSN: [id number] Arrival date & time: 07/09/17  1953     History   Chief Complaint Chief Complaint  Patient presents with  . Allergic Reaction    HPI Casey Duncan is a 37 y.o. female.  Patient states she took two bactrim earlier today and developed a pruritic rash on her extremities and chest, with itching around eyes and lips, mild shortness of breath. Patient received prednisone, benadryl, and zantac during triage assessment, with improvement in symptoms.   The history is provided by the patient. No language interpreter was used.  Allergic Reaction  Presenting symptoms: itching and rash   Presenting symptoms: no difficulty swallowing and no wheezing   Severity:  Moderate Prior allergic episodes:  No prior episodes Context: medications     Past Medical History:  Diagnosis Date  . Abnormal vaginal bleeding   . No pertinent past medical history     There are no active problems to display for this patient.   Past Surgical History:  Procedure Laterality Date  . NO PAST SURGERIES      OB History    Gravida Para Term Preterm AB Living   3 3 3    0 2   SAB TAB Ectopic Multiple Live Births                   Home Medications    Prior to Admission medications   Medication Sig Start Date End Date Taking? Authorizing Provider  acetaminophen (TYLENOL) 500 MG tablet Take 1,000 mg by mouth every 4 (four) hours as needed for mild pain.     [provider]  cephALEXin (KEFLEX) 500 MG capsule Take 1 capsule by mouth 2 (two) times daily. 12/29/15   [provider]  diphenhydrAMINE (BENADRYL) 25 mg capsule Take 25 mg by mouth every 6 (six) hours as needed for itching.    [provider]  doxycycline (VIBRA-TABS) 100 MG tablet Take 100 mg by mouth 2 (two) times daily. 10 days supply    [provider]  HYDROcodone-acetaminophen (NORCO/VICODIN) 5-325 MG tablet Take 1-2  tablets by mouth every 6 (six) hours as needed. Patient not taking: Reported on 01/05/2016 03/12/15   Roxy Horseman, PA-C  methocarbamol (ROBAXIN) 500 MG tablet Take 1 tablet (500 mg total) by mouth 2 (two) times daily. 08/05/16   Demetrios Loll T, PA-C  ondansetron (ZOFRAN) 4 MG tablet Take 4 mg by mouth every 8 (eight) hours as needed. 12/29/15   [provider]  oxyCODONE-acetaminophen (PERCOCET/ROXICET) 5-325 MG tablet Take 1-2 tablets by mouth every 4 (four) hours as needed. for pain 12/29/15   [provider]    Family History No family history on file.  Social History Social History   Tobacco Use  . Smoking status: Current Every Day Smoker    Packs/day: 1.00  . Smokeless tobacco: Never Used  Substance Use Topics  . Alcohol use: No    Comment: Patient denies  . Drug use: Yes    Types: Marijuana    Comment: Patient reports she occasionally uses THC     Allergies   Patient has no known allergies.   Review of Systems Review of Systems  HENT: Negative for drooling, facial swelling and trouble swallowing.   Respiratory: Negative for cough and wheezing.   Gastrointestinal: Negative for nausea and vomiting.  Skin: Positive for itching and rash.  All other systems reviewed and are negative.    Physical Exam  Updated Vital Signs BP 135/77 (BP Location: Right Arm)   Pulse 99   Temp 98 F (36.7 C) (Oral)   Resp 18   Ht 5\' 8"  (1.727 m)   Wt 83.9 kg (185 lb)   SpO2 100%   BMI 28.13 kg/m   Physical Exam  Constitutional: She is oriented to person, place, and time. She appears well-developed and well-nourished.  HENT:  Head: Normocephalic.  Nose: Nose normal.  Mouth/Throat: Oropharynx is clear and moist.  Eyes: Conjunctivae are normal.  Neck: Neck supple.  Cardiovascular: Normal rate and regular rhythm.  Pulmonary/Chest: Effort normal and breath sounds normal. No respiratory distress.  Abdominal: Soft.  Musculoskeletal: Normal range of motion.  She exhibits no edema.  Lymphadenopathy:    She has no cervical adenopathy.  Neurological: She is alert and oriented to person, place, and time.  Skin: Skin is warm and dry. Rash noted. There is erythema.  Psychiatric: She has a normal mood and affect.  Nursing note and vitals reviewed.    ED Treatments / Results  Labs (all labs ordered are listed, but only abnormal results are displayed) Labs Reviewed - No data to display  EKG  EKG Interpretation None       Radiology No results found.  Procedures Procedures (including critical care time)  Medications Ordered in ED Medications  ranitidine (ZANTAC) 150 MG/10ML syrup 150 mg (150 mg Oral Given 07/09/17 2056)  diphenhydrAMINE (BENADRYL) capsule 25 mg (25 mg Oral Given 07/09/17 2056)  predniSONE (DELTASONE) tablet 60 mg (60 mg Oral Given 07/09/17 2056)     Initial Impression / Assessment and Plan / ED Course  I have reviewed the triage vital signs and the nursing notes.  Pertinent labs & imaging results that were available during my care of the patient were reviewed by me and considered in my medical decision making (see chart for details).     Patient re-evaluated prior to dc, is hemodynamically stable, in no respiratory distress, and denies the feeling of throat closing. No indication of Stevens-Johnson syndrome. Pt has been advised to take benadryl, pepcid, and prednisone. Patient instructed to return to the ED if they have a recurrence of symptoms of mod-severe allergic rxn (s/s including throat closing, difficulty breathing, swelling of lips face or tongue). Pt is to follow up with their PCP. Pt is agreeable with plan & verbalizes understanding.  Final Clinical Impressions(s) / ED Diagnoses   Final diagnoses:  Allergic reaction to drug, initial encounter    ED Discharge Orders        Ordered    famotidine (PEPCID) 20 MG tablet  2 times daily     07/09/17 2216    diphenhydrAMINE (BENADRYL) 25 MG tablet  Every 6  hours     07/09/17 2216    predniSONE (DELTASONE) 20 MG tablet     07/09/17 2216       Felicie MornSmith, Delyla Sandeen, NP 07/10/17 0045    Tegeler, Canary Brimhristopher J, MD 07/10/17 [phone]

## 2017-07-09 NOTE — ED Provider Notes (Signed)
Patient placed in Quick Look pathway, seen and evaluated   Chief Complaint: rash   HPI:   Patient presents with acute onset of pruritic rash which occurred 45 minutes after taking 2 tablets of Bactrim.  Patient states that she took these tablets, which are left over from a prior groin abscess, due to "treat "a new groin abscess which the patient noticed today.  She states that approximately 45 minutes after taking the tablets she developed a pruritic rash to her extremities and chest.  Endorses mild shortness of breath and thinks her voice has changed.  Denies drooling or facial swelling.  No fevers or chills.  ROS: Rash, shortness of breath Negative for chest pain, fevers, chills, facial swelling, drooling, or difficulty swallowing.  Physical Exam:   Gen: No distress  Neuro: Awake and Alert  Psych: normal mood and behavoir    Focused Exam: Raised erythematous urticarial rash noted to the extremities and chest.  Equal rise and fall of chest, no increased work of breathing, no angioedema, no wheezing.  Speaking in full sentences without difficulty.  Tolerating secretions without difficulty.  Airway appears patent.   Initiation of care has begun. The patient has been counseled on the process, plan, and necessity for staying for the completion/evaluation, and the remainder of the medical screening examination    Bennye AlmFawze, Bellah Alia A, PA-C 07/09/17 2035    Margarita Grizzleay, Danielle, MD 07/13/17 1650

## 2017-07-09 NOTE — ED Triage Notes (Signed)
Pt to ED c/o hives and itching to feet, hands, and "now all over." Patient states she started taking a left over batch of Bactrim for an abscess she has. She states right after taking it, she noticed the hives and itching on her feet and it's since spread all over. Airway intact, resp e/u. Patient talking in clear, full sentences.
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
## 2018-03-22 ENCOUNTER — Ambulatory Visit (HOSPITAL_COMMUNITY)
Admission: EM | Admit: 2018-03-22 | Discharge: 2018-03-22 | Disposition: A | Discharge status: Home / Self Care | Payer: Medicaid Other | Attending: Family Medicine | Admitting: Family Medicine

## 2018-03-22 ENCOUNTER — Other Ambulatory Visit: Payer: Self-pay

## 2018-03-22 ENCOUNTER — Encounter (HOSPITAL_COMMUNITY): Payer: Self-pay | Admitting: Emergency Medicine

## 2018-03-22 DIAGNOSIS — N898 Other specified noninflammatory disorders of vagina: Secondary | ICD-10-CM | POA: Diagnosis present

## 2018-03-22 DIAGNOSIS — Z9889 Other specified postprocedural states: Secondary | ICD-10-CM

## 2018-03-22 DIAGNOSIS — F17210 Nicotine dependence, cigarettes, uncomplicated: Secondary | ICD-10-CM | POA: Diagnosis not present

## 2018-03-22 DIAGNOSIS — Z79899 Other long term (current) drug therapy: Secondary | ICD-10-CM | POA: Diagnosis not present

## 2018-03-22 DIAGNOSIS — B9689 Other specified bacterial agents as the cause of diseases classified elsewhere: Secondary | ICD-10-CM | POA: Diagnosis not present

## 2018-03-22 DIAGNOSIS — N72 Inflammatory disease of cervix uteri: Secondary | ICD-10-CM | POA: Insufficient documentation

## 2018-03-22 LAB — POCT PREGNANCY, URINE: Preg Test, Ur: NEGATIVE

## 2018-03-22 NOTE — ED Triage Notes (Signed)
The patient presented to the [facility] with a complaint of vaginal itching that started 2 weeks ago after a condom broke during sexual intercourse.

## 2018-03-22 NOTE — ED Provider Notes (Signed)
[facility]   [id number] 03/22/18 Arrival Time: 1039   [id number] itching and nipple changes  SUBJECTIVE:  Casey Duncan is a 37 y.o. female who presents with complaints of vaginal itching x 2 weeks.  Symptoms began after condom broke during sexual intercourse around the time of symptoms. Patient is sexually active with 1 female partner.  She denies aggravating or alleviating factors.  Denies previous symptoms in the past.  She denies fever, chills, nausea, vomiting, abdominal or pelvic pain, urinary symptoms, vaginal discharge, vaginal odor, vaginal bleeding, dyspareunia, vaginal rashes or lesions.   Patient also mentions skin changes around her left nipple after have breast reduction surgery a few months ago. Saw her surgeon regarding this concern and they told her it was normal healing.  Denies pain, redness, swelling, or discharge.    Patient's last menstrual period was 03/03/2018.  ROS: As per HPI.  Past Medical History:  Diagnosis Date  . Abnormal vaginal bleeding   . No pertinent past medical history    Past Surgical History:  Procedure Laterality Date  . BREAST SURGERY    . vaginal rejuvenation     No Known Allergies No current facility-administered medications on file prior to encounter.    Current Outpatient Medications on File Prior to Encounter  Medication Sig Dispense Refill  . diphenhydrAMINE (BENADRYL) 25 mg capsule Take 25 mg by mouth every 6 (six) hours as needed for itching.      Social History   Socioeconomic History  . Marital status: Single    Spouse name: Not on file  . Number of children: Not on file  . Years of education: Not on file  . Highest education level: Not on file  Occupational History  . Not on file  Social Needs  . Financial resource strain: Not on file  . Food insecurity:    Worry: Not on file    Inability: Not on file  . Transportation needs:    Medical: Not on file    Non-medical: Not on file  Tobacco Use  .  Smoking status: Current Every Day Smoker    Packs/day: 1.00  . Smokeless tobacco: Never Used  Substance and Sexual Activity  . Alcohol use: No    Comment: Patient denies  . Drug use: Yes    Types: Marijuana    Comment: Patient reports she occasionally uses THC  . Sexual activity: Yes    Birth control/protection: None  Lifestyle  . Physical activity:    Days per week: Not on file    Minutes per session: Not on file  . Stress: Not on file  Relationships  . Social connections:    Talks on phone: Not on file    Gets together: Not on file    Attends religious service: Not on file    Active member of club or organization: Not on file    Attends meetings of clubs or organizations: Not on file    Relationship status: Not on file  . Intimate partner violence:    Fear of current or ex partner: Not on file    Emotionally abused: Not on file    Physically abused: Not on file    Forced sexual activity: Not on file  Other Topics Concern  . Not on file  Social History Narrative  . Not on file   History reviewed. No pertinent family history.  OBJECTIVE:  Vitals:   03/22/18 1101  BP: 111/78  Pulse: 83  Resp: 16  Temp: 98.1  F (36.7 C)  TempSrc: Oral  SpO2: 100%     General appearance: alert, cooperative, appears stated age and no distress Throat: lips, mucosa, and tongue normal; teeth and gums normal Lungs: CTA bilaterally without adventitious breath sounds Heart: regular rate and rhythm.  Radial pulses 2+ symmetrical bilaterally Back: no CVA tenderness Breast: breast symmetrical; post surgical scars evident; mild light pink discoloration around the circumference of the left nipple; nontender; no drainage; no erythema Abdomen: soft, non-tender; bowel sounds normal; no guarding  GU: declines chaperone: external examination without vulvar lesions or erythema Bimanual exam: Negative for cervical motion or adenexal tenderness; Speculum exam: no discharge evident.  Cervix visualized  with erythema and punctate lesions, not friable. Cervical swab obtained Skin: warm and dry Psychological:  Alert and cooperative. Anxious mood and affect  Results for orders placed or performed during the hospital encounter of 03/22/18  Pregnancy, urine POC  Result Value Ref Range   Preg Test, Ur NEGATIVE NEGATIVE    Labs Reviewed  HIV ANTIBODY (ROUTINE TESTING W REFLEX)  RPR  POCT PREGNANCY, URINE  CERVICOVAGINAL ANCILLARY ONLY    ASSESSMENT & PLAN:  1. Vaginal itching   2. Cervicitis   3. H/O bilateral breast reduction surgery     No orders of the defined types were placed in this encounter.  Pending: Labs Reviewed  HIV ANTIBODY (ROUTINE TESTING W REFLEX)  RPR  POCT PREGNANCY, URINE  CERVICOVAGINAL ANCILLARY ONLY   Declines treatment today.  Would like to wait on results of her test Cervical cytology obtained HIV/ syphilis testing today If tests are positive, please abstain from sexual activity for at least 7 days and notify partners Follow up with PCP next week for reevaluation Return here or go to ER if you have any new or worsening symptoms    Follow up with surgeon regarding skin changes around nipple follow breast reduction surgery  Reviewed expectations re: course of current medical issues. Questions answered. Outlined signs and symptoms indicating need for more acute intervention. Patient verbalized understanding. After Visit Summary given.       Rennis Harding, PA-C 03/22/18 1302

## 2018-03-22 NOTE — Discharge Instructions (Addendum)
Declines treatment today.  Would like to wait on results of her test Cervical cytology obtained HIV/ syphilis testing today If tests are positive, please abstain from sexual activity for at least 7 days and notify partners Follow up with PCP next week for reevaluation Return here or go to ER if you have any new or worsening symptoms    Follow up with surgeon regarding skin changes around nipple follow breast reduction surgery

## 2018-03-23 LAB — HIV ANTIBODY (ROUTINE TESTING W REFLEX): HIV Screen 4th Generation wRfx: NONREACTIVE

## 2018-03-23 LAB — RPR: RPR Ser Ql: NONREACTIVE

## 2018-03-25 LAB — CERVICOVAGINAL ANCILLARY ONLY
Bacterial vaginitis: NEGATIVE
CHLAMYDIA, DNA PROBE: NEGATIVE
Candida vaginitis: NEGATIVE
NEISSERIA GONORRHEA: NEGATIVE
Trichomonas: NEGATIVE

## 2018-05-28 NOTE — L&D Delivery Note (Signed)
Delivery Note Patient pushed for less than 10 minutes after she was noted to be C/C/+2.  At 4:32 PM a viable and healthy female was delivered via Vaginal, Spontaneous (Presentation: Left Occiput Anterior).  APGAR: 8, 9; weight pending.  Baby laid on maternal abdomen. She was noted to have a vigorous cry and moving all four extremities.  Delayed cord clamping was done and cord cut by mother. Cord blood obtained. Placenta spontaneously delivered, intact, with 3 vessels noted.  Uterine atony was minimal and was alleviated with IV pitocin.  Second degree vaginal / perineal laceration was repaired in routine fashion with 2-0 vicryl and 3-0 chromic. Hemostasis was good.  There were no complications. Patient tolerated delivery well.  Cord pH: n/a Placenta: Spontaneous, intact Cord: 3 vessels Baby girl: Illene Silver Anesthesia: Epidural Episiotomy: None Lacerations: 2nd degree Suture Repair: 2.0 vicryl rapide and 3-0 chromic  Est. Blood Loss (mL):  100 mL  Mom to postpartum.  Baby to Couplet care / Skin to Skin.  Bacilio Abascal 05/25/2019, 5:06 PM

## 2018-07-01 ENCOUNTER — Ambulatory Visit (HOSPITAL_COMMUNITY)
Admission: EM | Admit: 2018-07-01 | Discharge: 2018-07-01 | Disposition: A | Discharge status: Home / Self Care | Payer: Medicaid Other | Attending: Internal Medicine | Admitting: Internal Medicine

## 2018-07-01 ENCOUNTER — Encounter (HOSPITAL_COMMUNITY): Payer: Self-pay

## 2018-07-01 DIAGNOSIS — Z113 Encounter for screening for infections with a predominantly sexual mode of transmission: Secondary | ICD-10-CM

## 2018-07-01 DIAGNOSIS — Z3202 Encounter for pregnancy test, result negative: Secondary | ICD-10-CM

## 2018-07-01 DIAGNOSIS — Z202 Contact with and (suspected) exposure to infections with a predominantly sexual mode of transmission: Secondary | ICD-10-CM | POA: Diagnosis not present

## 2018-07-01 DIAGNOSIS — G8929 Other chronic pain: Secondary | ICD-10-CM

## 2018-07-01 DIAGNOSIS — M549 Dorsalgia, unspecified: Secondary | ICD-10-CM | POA: Diagnosis not present

## 2018-07-01 LAB — POCT PREGNANCY, URINE: Preg Test, Ur: NEGATIVE

## 2018-07-01 MED ORDER — NORGESTIMATE-ETH ESTRADIOL 0.25-35 MG-MCG PO TABS: 1.0000 | ORAL_TABLET | Freq: Every day | ORAL | 3 refills | Status: DC
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
## 2018-07-01 NOTE — ED Triage Notes (Signed)
Pt presents with possible pregnancy, wanting to get STD screening, and ongoing back pain; pt states she feels a knot in her back not associated with any injury.

## 2018-07-03 ENCOUNTER — Telehealth (HOSPITAL_COMMUNITY): Payer: Self-pay | Admitting: Emergency Medicine

## 2018-07-03 LAB — CERVICOVAGINAL ANCILLARY ONLY
Bacterial vaginitis: POSITIVE — AB
CHLAMYDIA, DNA PROBE: POSITIVE — AB
Candida vaginitis: NEGATIVE
NEISSERIA GONORRHEA: NEGATIVE
Trichomonas: NEGATIVE

## 2018-07-03 MED ORDER — AZITHROMYCIN 250 MG PO TABS: 1000.0000 mg | ORAL_TABLET | Freq: Once | ORAL | 0 refills | Status: AC

## 2018-07-03 MED ORDER — AZITHROMYCIN 250 MG PO TABS: 1000.0000 mg | ORAL_TABLET | Freq: Once | ORAL | 0 refills | Status: DC

## 2018-07-03 MED ORDER — METRONIDAZOLE 500 MG PO TABS: 500.0000 mg | ORAL_TABLET | Freq: Two times a day (BID) | ORAL | 0 refills | Status: DC

## 2018-07-03 NOTE — Telephone Encounter (Signed)
Bacterial vaginosis is positive. This was not treated at the urgent care visit.  Flagyl 500 mg BID x 7 days #14 no refills sent to patients pharmacy of choice.    Chlamydia is positive.  Rx po zithromax 1g #1 dose no refills was sent to the pharmacy of record.  Pt needs education to please refrain from sexual intercourse for 7 days to give the medicine time to work, sexual partners need to be notified and tested/treated.  Condoms may reduce risk of reinfection.  Recheck or followup with PCP for further evaluation if symptoms are not improving.   [facility] notified.  Attempted to reach patient. No answer at this time. Voicemail left.

## 2018-07-03 NOTE — Telephone Encounter (Signed)
Patient contacted and made aware of all results, all questions answered. Pharmacy change.

## 2018-07-04 ENCOUNTER — Telehealth (HOSPITAL_COMMUNITY): Payer: Self-pay

## 2018-07-04 MED ORDER — METRONIDAZOLE 500 MG PO TABS: 500.0000 mg | ORAL_TABLET | Freq: Two times a day (BID) | ORAL | 0 refills | Status: AC

## 2018-07-04 MED ORDER — AZITHROMYCIN 250 MG PO TABS: 1000.0000 mg | ORAL_TABLET | Freq: Once | ORAL | 0 refills | Status: AC

## 2018-07-04 NOTE — Telephone Encounter (Signed)
Pt states that her son picked up her rx and left it in his car and now he has left to go out of town. Rx for one time dose of Azithromycin and 1 week of Flagyl has been sent to pharmacy of choice.

## 2018-07-05 ENCOUNTER — Other Ambulatory Visit: Payer: Self-pay

## 2018-07-05 ENCOUNTER — Emergency Department (HOSPITAL_COMMUNITY)
Admission: EM | Admit: 2018-07-05 | Discharge: 2018-07-06 | Discharge status: Against Medical Advice | Payer: Medicaid Other | Attending: Emergency Medicine | Admitting: Emergency Medicine

## 2018-07-05 DIAGNOSIS — F1290 Cannabis use, unspecified, uncomplicated: Secondary | ICD-10-CM | POA: Diagnosis not present

## 2018-07-05 DIAGNOSIS — F17210 Nicotine dependence, cigarettes, uncomplicated: Secondary | ICD-10-CM | POA: Diagnosis not present

## 2018-07-05 DIAGNOSIS — G44209 Tension-type headache, unspecified, not intractable: Secondary | ICD-10-CM | POA: Insufficient documentation

## 2018-07-05 DIAGNOSIS — R51 Headache: Secondary | ICD-10-CM | POA: Diagnosis present

## 2018-07-05 DIAGNOSIS — Z202 Contact with and (suspected) exposure to infections with a predominantly sexual mode of transmission: Secondary | ICD-10-CM | POA: Diagnosis not present

## 2018-07-05 DIAGNOSIS — Z5320 Procedure and treatment not carried out because of patient's decision for unspecified reasons: Secondary | ICD-10-CM | POA: Diagnosis not present

## 2018-07-05 LAB — CBC
HCT: 37.9 % (ref 36.0–46.0)
HEMOGLOBIN: 12 g/dL (ref 12.0–15.0)
MCH: 29.4 pg (ref 26.0–34.0)
MCHC: 31.7 g/dL (ref 30.0–36.0)
MCV: 92.9 fL (ref 80.0–100.0)
PLATELETS: 399 10*3/uL (ref 150–400)
RBC: 4.08 MIL/uL (ref 3.87–5.11)
RDW: 14.7 % (ref 11.5–15.5)
WBC: 16.2 10*3/uL — ABNORMAL HIGH (ref 4.0–10.5)
nRBC: 0 % (ref 0.0–0.2)

## 2018-07-05 LAB — I-STAT BETA HCG BLOOD, ED (MC, WL, AP ONLY): I-stat hCG, quantitative: <5 m[IU]/mL (ref <5)

## 2018-07-05 NOTE — ED Triage Notes (Addendum)
Patient c/o HA x 1 month. Denies blurred vision. Also c/o n/v. Would also like an HIV test.

## 2018-07-06 LAB — COMPREHENSIVE METABOLIC PANEL
ALK PHOS: 82 U/L (ref 38–126)
ALT: 11 U/L (ref 0–44)
AST: 17 U/L (ref 15–41)
Albumin: 3.8 g/dL (ref 3.5–5.0)
Anion gap: 12 (ref 5–15)
BILIRUBIN TOTAL: 0.6 mg/dL (ref 0.3–1.2)
BUN: 9 mg/dL (ref 6–20)
CALCIUM: 8.5 mg/dL — AB (ref 8.9–10.3)
CO2: 22 mmol/L (ref 22–32)
Chloride: 104 mmol/L (ref 98–111)
Creatinine, Ser: 0.9 mg/dL (ref 0.44–1.00)
GFR calc Af Amer: >60 mL/min (ref >60)
GFR calc non Af Amer: >60 mL/min (ref >60)
Glucose, Bld: 108 mg/dL — ABNORMAL HIGH (ref 70–99)
Potassium: 3.8 mmol/L (ref 3.5–5.1)
SODIUM: 138 mmol/L (ref 135–145)
TOTAL PROTEIN: 7.2 g/dL (ref 6.5–8.1)

## 2018-07-06 LAB — RAPID HIV SCREEN (HIV 1/2 AB+AG)
HIV 1/2 Antibodies: NONREACTIVE
HIV-1 P24 ANTIGEN - HIV24: NONREACTIVE

## 2018-07-06 LAB — LIPASE, BLOOD: Lipase: 36 U/L (ref 11–51)

## 2018-07-06 LAB — RPR: RPR Ser Ql: NONREACTIVE

## 2018-07-06 MED ORDER — DIPHENHYDRAMINE HCL 50 MG/ML IJ SOLN
25.0000 mg | Freq: Once | INTRAMUSCULAR | Status: AC
  Administered 2018-07-06: 25 mg via INTRAVENOUS
  Filled 2018-07-06: qty 1

## 2018-07-06 MED ORDER — METOCLOPRAMIDE HCL 5 MG/ML IJ SOLN
10.0000 mg | Freq: Once | INTRAMUSCULAR | Status: AC
  Administered 2018-07-06: 10 mg via INTRAVENOUS
  Filled 2018-07-06: qty 2

## 2018-07-06 MED ORDER — SODIUM CHLORIDE 0.9 % IV BOLUS
1000.0000 mL | Freq: Once | INTRAVENOUS | Status: AC
  Administered 2018-07-06: 1000 mL via INTRAVENOUS

## 2018-07-06 MED ORDER — DEXAMETHASONE SODIUM PHOSPHATE 10 MG/ML IJ SOLN
10.0000 mg | Freq: Once | INTRAMUSCULAR | Status: AC
  Administered 2018-07-06: 10 mg via INTRAVENOUS
  Filled 2018-07-06: qty 1

## 2018-07-06 NOTE — ED Notes (Signed)
Pt left AMA °

## 2018-07-06 NOTE — ED Notes (Signed)
Nurse drawing labs. 

## 2018-07-06 NOTE — ED Notes (Signed)
Pt became anxious and visibly upset after the administrations of meds. She stated that she could not find a position of comfort and had to wake up early for school. She requested that her IV be removed and to speak to the provider. Her Iv was removed and then promptly put her clothes on. She left the hospital, which was unwitnessed.

## 2018-07-06 NOTE — ED Provider Notes (Signed)
[facility] EMERGENCY DEPARTMENT Provider Note   CSN: [id number] Arrival date & time: 07/05/18  2310     History   Chief Complaint Chief Complaint  Patient presents with  . Headache    HPI Casey Duncan is a 38 y.o. female.  The history is provided by the patient.  Headache  She is complaining of a headache for the last several weeks.  Headache starts on the right side but can spread to the entire head.  It is intermittent but usually does not wake her up from sleep and does not keep her from sleeping.  Ibuprofen generally gives temporary relief of the headache.  She is unable to describe the headache.  She denies photophobia or phonophobia.  She denies fever.  There has been no visual change and no nausea or vomiting.  Also, she is requesting an HIV test.  5 days ago, she had an episode of sexual intercourse where the condom came off and got lodged in the vagina and she had to go to an urgent care center to have the condom removed.  Testing at that time was positive for chlamydia and she is very concerned about possible HIV exposure.  Also, she was diagnosed with bacterial vaginosis and given prescription for metronidazole.  She denies any vaginal discharge or odor.  Past Medical History:  Diagnosis Date  . Abnormal vaginal bleeding   . No pertinent past medical history     There are no active problems to display for this patient.   Past Surgical History:  Procedure Laterality Date  . BREAST SURGERY    . vaginal rejuvenation       OB History    Gravida  3   Para  3   Term  3   Preterm      AB  0   Living  2     SAB      TAB      Ectopic      Multiple      Live Births               Home Medications    Prior to Admission medications   Medication Sig Start Date End Date Taking? Authorizing Provider  diphenhydrAMINE (BENADRYL) 25 mg capsule Take 25 mg by mouth every 6 (six) hours as needed for itching.    [provider]   metroNIDAZOLE (FLAGYL) 500 MG tablet Take 1 tablet (500 mg total) by mouth 2 (two) times daily for 7 days. 07/04/18 07/11/18  Eustace Moore, MD  norgestimate-ethinyl estradiol (SPRINTEC 28) 0.25-35 MG-MCG tablet Take 1 tablet by mouth daily. 07/01/18   Lamptey, Britta Mccreedy, MD    Family History No family history on file.  Social History Social History   Tobacco Use  . Smoking status: Current Every Day Smoker    Packs/day: 1.00  . Smokeless tobacco: Never Used  Substance Use Topics  . Alcohol use: No    Comment: Patient denies  . Drug use: Yes    Types: Marijuana    Comment: Patient reports she occasionally uses THC     Allergies   Patient has no known allergies.   Review of Systems Review of Systems  Neurological: Positive for headaches.  All other systems reviewed and are negative.    Physical Exam Updated Vital Signs BP 116/83   Pulse 82   Temp (!) 97.5 F (36.4 C) (Oral)   Resp 18   Ht 5' 8.5" (1.74 m)  Wt 95.3 kg   LMP 06/10/2018 (Approximate)   SpO2 99%   BMI 31.47 kg/m   Physical Exam Vitals signs and nursing note reviewed.    38 year old female, resting comfortably and in no acute distress. Vital signs are normal. Oxygen saturation is 99%, which is normal. Head is normocephalic and atraumatic. PERRLA, EOMI. Oropharynx is clear.  Fundi show no hemorrhage, exudate, papilledema.  There is tenderness to palpation over the temporalis muscles bilaterally, and over the insertion of the right paracervical muscles. Neck is nontender and supple without adenopathy or JVD. Back is nontender and there is no CVA tenderness. Lungs are clear without rales, wheezes, or rhonchi. Chest is nontender. Heart has regular rate and rhythm without murmur. Abdomen is soft, flat, nontender without masses or hepatosplenomegaly and peristalsis is normoactive. Extremities have no cyanosis or edema, full range of motion is present. Skin is warm and dry without rash. Neurologic:  Mental status is normal, cranial nerves are intact, there are no motor or sensory deficits.  ED Treatments / Results  Labs (all labs ordered are listed, but only abnormal results are displayed) Labs Reviewed  COMPREHENSIVE METABOLIC PANEL - Abnormal; Notable for the following components:      Result Value   Glucose, Bld 108 (*)    Calcium 8.5 (*)    All other components within normal limits  CBC - Abnormal; Notable for the following components:   WBC 16.2 (*)    All other components within normal limits  LIPASE, BLOOD  RAPID HIV SCREEN (HIV 1/2 AB+AG)  URINALYSIS, ROUTINE W REFLEX MICROSCOPIC  RPR  I-STAT BETA HCG BLOOD, ED ([facility], [facility], [facility] ONLY)   Procedures Procedures   Medications Ordered in ED Medications  sodium chloride 0.9 % bolus 1,000 mL (0 mLs Intravenous Stopped 07/06/18 0121)  metoCLOPramide (REGLAN) injection 10 mg (10 mg Intravenous Given 07/06/18 0052)  diphenhydrAMINE (BENADRYL) injection 25 mg (25 mg Intravenous Given 07/06/18 0052)  dexamethasone (DECADRON) injection 10 mg (10 mg Intravenous Given 07/06/18 0052)     Initial Impression / Assessment and Plan / ED Course  I have reviewed the triage vital signs and the nursing notes.  Pertinent lab results that were available during my care of the patient were reviewed by me and considered in my medical decision making (see chart for details).  Headache with clinical and physical exam features strongly suggestive of muscle contraction headache.  On discussing her STD exposure, patient became quite tearful and I feel that this concern is making her muscle contraction headache worse.  Old records are reviewed confirming ED visit for condom retrieval and positive chlamydia at that time as well as wet prep positive for clue cells.  Rapid HIV and RPR are ordered and she is given a headache cocktail of normal saline, metoclopramide, diphenhydramine, dexamethasone.  Patient advised that she will need to have repeat HIV test in 3 months  and 6 months to make sure that there was not seroconversion.  HIV has come back nonreactive.  Patient became very anxious and insisted on leaving and left AGAINST MEDICAL ADVICE before I had an opportunity to talk with her.  However, discussion had already been held regarding need for follow-up HIV testing.  RPR is still pending.  Final Clinical Impressions(s) / ED Diagnoses   Final diagnoses:  Muscle contraction headache    ED Discharge Orders    None       Dione BoozeGlick, Orlen Leedy, MD 07/06/18 [phone]

## 2018-08-20 ENCOUNTER — Encounter: Payer: Self-pay | Admitting: Emergency Medicine

## 2018-08-20 ENCOUNTER — Emergency Department
Admission: EM | Admit: 2018-08-20 | Discharge: 2018-08-20 | Disposition: A | Discharge status: Home / Self Care | Payer: Medicaid Other | Source: Home / Self Care

## 2018-08-20 ENCOUNTER — Other Ambulatory Visit: Payer: Self-pay

## 2018-08-20 ENCOUNTER — Other Ambulatory Visit (HOSPITAL_COMMUNITY)
Admission: RE | Admit: 2018-08-20 | Discharge: 2018-08-20 | Disposition: A | Discharge status: Home / Self Care | Payer: Medicaid Other | Source: Ambulatory Visit | Attending: Physician Assistant | Admitting: Physician Assistant

## 2018-08-20 DIAGNOSIS — F17200 Nicotine dependence, unspecified, uncomplicated: Secondary | ICD-10-CM | POA: Insufficient documentation

## 2018-08-20 DIAGNOSIS — Z79899 Other long term (current) drug therapy: Secondary | ICD-10-CM | POA: Insufficient documentation

## 2018-08-20 DIAGNOSIS — N760 Acute vaginitis: Secondary | ICD-10-CM | POA: Diagnosis not present

## 2018-08-20 DIAGNOSIS — H00021 Hordeolum internum right upper eyelid: Secondary | ICD-10-CM

## 2018-08-20 DIAGNOSIS — B9689 Other specified bacterial agents as the cause of diseases classified elsewhere: Secondary | ICD-10-CM | POA: Diagnosis present

## 2018-08-20 DIAGNOSIS — N898 Other specified noninflammatory disorders of vagina: Secondary | ICD-10-CM

## 2018-08-20 LAB — POCT URINALYSIS DIP (MANUAL ENTRY)
Bilirubin, UA: NEGATIVE
Glucose, UA: NEGATIVE mg/dL
Ketones, POC UA: NEGATIVE mg/dL
Leukocytes, UA: NEGATIVE
Nitrite, UA: NEGATIVE
Protein Ur, POC: NEGATIVE mg/dL
Spec Grav, UA: >=1.03 — AB (ref 1.010–1.025)
Urobilinogen, UA: 0.2 E.U./dL
pH, UA: 5.5 (ref 5.0–8.0)

## 2018-08-20 LAB — POCT URINE PREGNANCY: Preg Test, Ur: NEGATIVE

## 2018-08-20 MED ORDER — ERYTHROMYCIN 5 MG/GM OP OINT: TOPICAL_OINTMENT | OPHTHALMIC | 0 refills | Status: DC

## 2018-08-20 NOTE — ED Notes (Signed)
Pt ambulatory upon discharge. NAD noted; chest rise and fall symmetrically; Discharge instructions reviewed and plan of care understood. Pt alert and oriented x 4.

## 2018-08-20 NOTE — Discharge Instructions (Signed)
°  Refrain from sexual activity including oral sex and intercourse until test results come back and then for at least 7 days after treatment. Be sure to have all partners tested and treated for STDs.  Practice safe sex by always using condoms.

## 2018-08-20 NOTE — ED Triage Notes (Signed)
Pt comes in today with a c/o Right eye swelling along with blurred vision. Pt states that she noted these symptoms on Saturday. Pt also c/o vaginal discharge that is white in color, odor associated with, and painful during intercourse. Pt also c/o lower abdominal pain. Pt denies burning and/or bleeding upon urination. Pt PASSWORD [id number]. Callback number [phone].

## 2018-08-20 NOTE — ED Provider Notes (Signed)
[facility]    CSN: [id number] Arrival date & time: 08/20/18  1613     History   Chief Complaint Chief Complaint  Patient presents with  . Eye Problem  . Vaginal Discharge    HPI Casey Duncan is a 38 y.o. female.   HPI  Casey Duncan is a 38 y.o. female presenting to UC with c/o 2 days of white malodorous vaginal discharge, lower abdominal cramping and pain with intercourse.  Hx of chlamydia and BV last month, successfully treated with confirmed negative test after treatment, however, she is concerned her boyfriend may have had intercourse with his ex- since they were both treated last month.  Pt denies pain with urination. LMP 1 week ago but she is still requesting a pregnancy test. Pt is not on birth control and has unprotected intercourse. She notes she is trying to become pregnant. She does have a GYN, however, due to the Covid-19 crisis, she was directed to be evaluated in UC to help limit patients in the GYN offices to help protect pregnant patients.   Pt denies fever, chills, n/v/d.  She is also c/o 4 day hx of gradually worsening Right upper eyelid pain, swelling, redness and watering.  Occasional blurred vision in Right eye. No recent URI symptoms of cough, congestion or rhinorrhea. No recent eye trauma. She wears glasses but not contacts.    Past Medical History:  Diagnosis Date  . Abnormal vaginal bleeding   . No pertinent past medical history     There are no active problems to display for this patient.   Past Surgical History:  Procedure Laterality Date  . BREAST SURGERY    . vaginal rejuvenation      OB History    Gravida  3   Para  3   Term  3   Preterm      AB  0   Living  2     SAB      TAB      Ectopic      Multiple      Live Births               Home Medications    Prior to Admission medications   Medication Sig Start Date End Date Taking? Authorizing Provider  diphenhydrAMINE (BENADRYL) 25 mg capsule Take  25 mg by mouth every 6 (six) hours as needed for itching.    [provider]  erythromycin ophthalmic ointment Place a 1/2 inch ribbon of ointment into the lower eyelid 3 times daily for 7 days 08/20/18   Lurene Shadow, PA-C  norgestimate-ethinyl estradiol (SPRINTEC 28) 0.25-35 MG-MCG tablet Take 1 tablet by mouth daily. 07/01/18   Lamptey, Britta Mccreedy, MD    Family History History reviewed. No pertinent family history.  Social History Social History   Tobacco Use  . Smoking status: Current Every Day Smoker    Packs/day: 1.00  . Smokeless tobacco: Never Used  Substance Use Topics  . Alcohol use: No    Comment: Patient denies  . Drug use: Yes    Types: Marijuana    Comment: Patient reports she occasionally uses THC     Allergies   Patient has no known allergies.   Review of Systems Review of Systems  Gastrointestinal: Positive for abdominal pain. Negative for diarrhea, nausea and vomiting.  Genitourinary: Positive for dyspareunia and vaginal discharge. Negative for dysuria, flank pain, frequency, genital sores, pelvic pain and urgency.  Musculoskeletal: Negative for  back pain.  Neurological: Negative for dizziness, light-headedness and headaches.     Physical Exam Triage Vital Signs ED Triage Vitals  Enc Vitals Group     BP      Pulse      Resp      Temp      Temp src      SpO2      Weight      Height      Head Circumference      Peak Flow      Pain Score      Pain Loc      Pain Edu?      Excl. in GC?    No data found.  Updated Vital Signs BP 123/86 (BP Location: Right Arm)   Pulse 85   Temp (!) 97.4 F (36.3 C) (Oral)   Resp 16   Ht 5\' 9"  (1.753 m)   Wt 204 lb 4 oz (92.6 kg)   LMP 08/13/2018   SpO2 96%   BMI 30.16 kg/m   Visual Acuity Right Eye Distance: 20/25 Left Eye Distance: 20/25 Bilateral Distance: 20/25  Right Eye Near:   Left Eye Near:    Bilateral Near:     Physical Exam Vitals signs and nursing note reviewed.   Constitutional:      Appearance: Normal appearance. She is well-developed.  HENT:     Head: Normocephalic and atraumatic.     Nose: Nose normal.     Mouth/Throat:     Mouth: Mucous membranes are moist.  Eyes:     General: No scleral icterus.       Right eye: Hordeolum ( upper) present. No discharge.        Left eye: No discharge.     Extraocular Movements: Extraocular movements intact.     Conjunctiva/sclera: Conjunctivae normal.     Right eye: Right conjunctiva is not injected.     Pupils: Pupils are equal, round, and reactive to light.   Neck:     Musculoskeletal: Normal range of motion.  Cardiovascular:     Rate and Rhythm: Normal rate.  Pulmonary:     Effort: Pulmonary effort is normal.  Abdominal:     Palpations: Abdomen is soft.     Tenderness: There is no abdominal tenderness. There is no right CVA tenderness or left CVA tenderness.  Musculoskeletal: Normal range of motion.  Skin:    General: Skin is warm and dry.  Neurological:     Mental Status: She is alert and oriented to person, place, and time.  Psychiatric:        Behavior: Behavior normal.      UC Treatments / Results  Labs (all labs ordered are listed, but only abnormal results are displayed) Labs Reviewed  POCT URINALYSIS DIP (MANUAL ENTRY) - Abnormal; Notable for the following components:      Result Value   Spec Grav, UA >=1.030 (*)    Blood, UA moderate (*)    All other components within normal limits  POCT URINE PREGNANCY - Normal  CERVICOVAGINAL ANCILLARY ONLY    EKG None  Radiology No results found.  Procedures Procedures (including critical care time)  Medications Ordered in UC Medications - No data to display  Initial Impression / Assessment and Plan / UC Course  I have reviewed the triage vital signs and the nursing notes.  Pertinent labs & imaging results that were available during my care of the patient were reviewed by me and considered in  my medical decision making (see  chart for details).     UA no evidence of UTI Self swab sent to lab to test for GC/chlamydia, yeast, and trich.  Eye exam c/w hordeolum, will tx with erythromycin ointment due to worsening pain and erythema Encouraged warm compresses  AVS provided  Final Clinical Impressions(s) / UC Diagnoses   Final diagnoses:  Acute vaginitis  Vaginal discharge  Hordeolum internum of right upper eyelid     Discharge Instructions      Refrain from sexual activity including oral sex and intercourse until test results come back and then for at least 7 days after treatment. Be sure to have all partners tested and treated for STDs.  Practice safe sex by always using condoms.      ED Prescriptions    Medication Sig Dispense Auth. Provider   erythromycin ophthalmic ointment Place a 1/2 inch ribbon of ointment into the lower eyelid 3 times daily for 7 days 3.5 g Lurene Shadow, PA-C     Controlled Substance Prescriptions [place] Controlled Substance Registry consulted? Not Applicable   Rolla Plate 08/20/18 1925

## 2018-08-22 ENCOUNTER — Telehealth: Payer: Self-pay | Admitting: Emergency Medicine

## 2018-08-22 LAB — CERVICOVAGINAL ANCILLARY ONLY
Bacterial vaginitis: POSITIVE — AB
Candida vaginitis: NEGATIVE
Chlamydia: NEGATIVE
Neisseria Gonorrhea: NEGATIVE
Trichomonas: NEGATIVE

## 2018-08-22 MED ORDER — CLINDAMYCIN HCL 150 MG PO CAPS: 300.0000 mg | ORAL_CAPSULE | Freq: Three times a day (TID) | ORAL | 0 refills | Status: DC

## 2018-08-22 NOTE — Telephone Encounter (Signed)
Patient said her eye is getting worse and thinks she is having a reaction to the drops. She has been researching and would like to try doxycycline.

## 2018-08-23 MED ORDER — CLINDAMYCIN HCL 300 MG PO CAPS: 300.0000 mg | ORAL_CAPSULE | Freq: Three times a day (TID) | ORAL | 0 refills | Status: DC

## 2018-08-23 NOTE — Telephone Encounter (Signed)
Pt informed new script sent to correct Pharmacy. Per Dr Cathren Harsh, ok to send Clindamycin HCL 300 TID #21. Pt notified.

## 2018-08-25 ENCOUNTER — Telehealth: Payer: Self-pay

## 2018-08-25 MED ORDER — METRONIDAZOLE 500 MG PO TABS: 500.0000 mg | ORAL_TABLET | Freq: Two times a day (BID) | ORAL | 0 refills | Status: DC

## 2018-08-25 MED ORDER — FLUCONAZOLE 150 MG PO TABS: 150.0000 mg | ORAL_TABLET | Freq: Every day | ORAL | 0 refills | Status: DC

## 2018-08-25 NOTE — Telephone Encounter (Signed)
Called patient and gave positive BV results.  Medications sent to pharmacy.

## 2018-09-01 ENCOUNTER — Telehealth: Payer: Self-pay | Admitting: *Deleted

## 2018-09-01 MED ORDER — CLINDAMYCIN HCL 300 MG PO CAPS: 300.0000 mg | ORAL_CAPSULE | Freq: Three times a day (TID) | ORAL | 0 refills | Status: DC

## 2018-09-01 NOTE — Telephone Encounter (Signed)
Pt called reports that her eye was improving with clindamycin, but not completely better. Since she has stopped the ABT her eye problem has continued. Per dr Cathren Harsh okay to RF clindamycin for 1 more week. If she is failing to improve or worsen within the next 5 days she will need to f/u with an opthalmologist. Pt verbalized understanding,.

## 2018-09-07 ENCOUNTER — Encounter (HOSPITAL_COMMUNITY): Payer: Self-pay

## 2018-09-07 ENCOUNTER — Ambulatory Visit (HOSPITAL_COMMUNITY)
Admission: EM | Admit: 2018-09-07 | Discharge: 2018-09-07 | Disposition: A | Discharge status: Home / Self Care | Payer: Medicaid Other | Attending: Urgent Care | Admitting: Urgent Care

## 2018-09-07 ENCOUNTER — Other Ambulatory Visit: Payer: Self-pay

## 2018-09-07 DIAGNOSIS — Z7251 High risk heterosexual behavior: Secondary | ICD-10-CM | POA: Insufficient documentation

## 2018-09-07 DIAGNOSIS — L0390 Cellulitis, unspecified: Secondary | ICD-10-CM

## 2018-09-07 DIAGNOSIS — L0231 Cutaneous abscess of buttock: Secondary | ICD-10-CM | POA: Insufficient documentation

## 2018-09-07 MED ORDER — FLUCONAZOLE 150 MG PO TABS: 150.0000 mg | ORAL_TABLET | ORAL | 0 refills | Status: DC

## 2018-09-07 MED ORDER — SULFAMETHOXAZOLE-TRIMETHOPRIM 800-160 MG PO TABS: 1.0000 | ORAL_TABLET | Freq: Two times a day (BID) | ORAL | 0 refills | Status: DC

## 2018-09-07 NOTE — ED Triage Notes (Signed)
Pt cc she  Has a abscess on her left buttock and the right side of her groin.This has been going on for a month. She thinks one of them may have burst. Pt state she would like to have a STD testing. Pt states she would like something to help her to stop smoking.

## 2018-09-07 NOTE — ED Provider Notes (Signed)
MRN: [id number] DOB: 07-Jun-1980  Subjective:   Casey Duncan is a 38 y.o. female presenting for acute onset of right groin painful swelling, left buttock mass that drained ~2 days ago and is improving. Patient wants to be checked for STIs. She has been taking clindamycin for an eye mass.  She has undergone 2 rounds of this and her high problem persists.   No current facility-administered medications for this encounter.   Current Outpatient Medications:  .  clindamycin (CLEOCIN) 300 MG capsule, Take 1 capsule (300 mg total) by mouth 3 (three) times daily., Disp: 21 capsule, Rfl: 0 .  diphenhydrAMINE (BENADRYL) 25 mg capsule, Take 25 mg by mouth every 6 (six) hours as needed for itching., Disp: , Rfl:  .  erythromycin ophthalmic ointment, Place a 1/2 inch ribbon of ointment into the lower eyelid 3 times daily for 7 days, Disp: 3.5 g, Rfl: 0 .  fluconazole (DIFLUCAN) 150 MG tablet, Take 1 tablet (150 mg total) by mouth daily. Once sx begin, Disp: 1 tablet, Rfl: 0 .  metroNIDAZOLE (FLAGYL) 500 MG tablet, Take 1 tablet (500 mg total) by mouth 2 (two) times daily., Disp: 14 tablet, Rfl: 0 .  norgestimate-ethinyl estradiol (SPRINTEC 28) 0.25-35 MG-MCG tablet, Take 1 tablet by mouth daily., Disp: 1 Package, Rfl: 3    No Known Allergies   Past Medical History:  Diagnosis Date  . Abnormal vaginal bleeding   . No pertinent past medical history      Past Surgical History:  Procedure Laterality Date  . BREAST SURGERY    . vaginal rejuvenation      ROS  Objective:   Vitals: BP 120/80 (BP Location: Right Arm)   Pulse 100   Temp 98.4 F (36.9 C) (Oral)   Resp (!) 86   Wt 210 lb (95.3 kg)   LMP 09/07/2018   SpO2 100%   BMI 31.01 kg/m   Physical Exam Constitutional:      General: She is not in acute distress.    Appearance: Normal appearance. She is well-developed. She is not ill-appearing.  HENT:     Head: Normocephalic and atraumatic.     Nose: Nose normal.     Mouth/Throat:      Mouth: Mucous membranes are moist.     Pharynx: Oropharynx is clear.  Eyes:     General: No scleral icterus.    Extraocular Movements: Extraocular movements intact.     Pupils: Pupils are equal, round, and reactive to light.  Cardiovascular:     Rate and Rhythm: Normal rate.  Pulmonary:     Effort: Pulmonary effort is normal.  Genitourinary:      Comments: Patient declined chaperone. Skin:    General: Skin is warm and dry.  Neurological:     General: No focal deficit present.     Mental Status: She is alert and oriented to person, place, and time.  Psychiatric:        Mood and Affect: Mood normal.        Behavior: Behavior normal.     Assessment and Plan :   Cellulitis, unspecified cellulitis site  Abscess of left buttock  Unprotected sex  Patient refused I&D and I am in agreement given lack of central fluctuance.  Will use Bactrim and warm compresses.  Use Diflucan for yeast infection associated with antibiotic use.  Counseled the she should contact an ophthalmologist for her ongoing right eyelid mass.  Also recommended she check with her PCP about starting medications for  smoking cessation.  Patient was agreeable to this treatment plan. Counseled patient on potential for adverse effects with medications prescribed today, patient verbalized understanding. ER and return-to-clinic precautions discussed, patient verbalized understanding.    Wallis BambergMani, Jhada Risk, PA-C 09/07/18 1312

## 2018-09-08 LAB — CERVICOVAGINAL ANCILLARY ONLY
Bacterial vaginitis: NEGATIVE
Chlamydia: NEGATIVE
Neisseria Gonorrhea: NEGATIVE
Trichomonas: NEGATIVE

## 2018-09-11 ENCOUNTER — Ambulatory Visit (HOSPITAL_COMMUNITY)
Admission: EM | Admit: 2018-09-11 | Discharge: 2018-09-11 | Disposition: A | Discharge status: Home / Self Care | Payer: Medicaid Other | Attending: Internal Medicine | Admitting: Internal Medicine

## 2018-09-11 ENCOUNTER — Encounter (HOSPITAL_COMMUNITY): Payer: Self-pay

## 2018-09-11 ENCOUNTER — Other Ambulatory Visit: Payer: Self-pay

## 2018-09-11 DIAGNOSIS — L0291 Cutaneous abscess, unspecified: Secondary | ICD-10-CM | POA: Diagnosis not present

## 2018-09-11 MED ORDER — LIDOCAINE HCL 2 % IJ SOLN
INTRAMUSCULAR | Status: AC
  Filled 2018-09-11: qty 20

## 2018-09-11 NOTE — Discharge Instructions (Addendum)
Continue the bactrim as prescribed Keep doing the warm compresses.

## 2018-09-11 NOTE — ED Triage Notes (Signed)
Abcess, not improving

## 2018-09-12 NOTE — ED Provider Notes (Signed)
[facility]    CSN: [id number] Arrival date & time: 09/11/18  1149     History   Chief Complaint Chief Complaint  Patient presents with  . Abscess    HPI Casey Duncan is a 38 y.o. female.   Patient is a 38 year old female who presents today with abscess.  She was seen here on 09/07/2018.  She was started on Bactrim at that time and has been taking the medication as prescribed.  Reports that the abscess has had no improvement.  She has been doing warm compresses.  She has had small amount of drainage.  Denies any associated fevers, chills, body aches, night sweats.  ROS per HPI      Past Medical History:  Diagnosis Date  . Abnormal vaginal bleeding   . No pertinent past medical history     There are no active problems to display for this patient.   Past Surgical History:  Procedure Laterality Date  . BREAST SURGERY    . vaginal rejuvenation      OB History    Gravida  3   Para  3   Term  3   Preterm      AB  0   Living  2     SAB      TAB      Ectopic      Multiple      Live Births               Home Medications    Prior to Admission medications   Medication Sig Start Date End Date Taking? Authorizing Provider  clindamycin (CLEOCIN) 300 MG capsule Take 1 capsule (300 mg total) by mouth 3 (three) times daily. 09/01/18   Lattie HawBeese, Stephen A, MD  diphenhydrAMINE (BENADRYL) 25 mg capsule Take 25 mg by mouth every 6 (six) hours as needed for itching.    [provider]  erythromycin ophthalmic ointment Place a 1/2 inch ribbon of ointment into the lower eyelid 3 times daily for 7 days 08/20/18   Lurene ShadowPhelps, Erin O, PA-C  fluconazole (DIFLUCAN) 150 MG tablet Take 1 tablet (150 mg total) by mouth once a week. Once sx begin 09/07/18   Wallis BambergMani, Mario, PA-C  metroNIDAZOLE (FLAGYL) 500 MG tablet Take 1 tablet (500 mg total) by mouth 2 (two) times daily. 08/25/18   Lurene ShadowPhelps, Erin O, PA-C  norgestimate-ethinyl estradiol (SPRINTEC 28) 0.25-35  MG-MCG tablet Take 1 tablet by mouth daily. 07/01/18   Lamptey, Britta MccreedyPhilip O, MD  sulfamethoxazole-trimethoprim (BACTRIM DS,SEPTRA DS) 800-160 MG tablet Take 1 tablet by mouth 2 (two) times daily. 09/07/18   Wallis BambergMani, Mario, PA-C    Family History Family History  Problem Relation Age of Onset  . Healthy Mother   . Healthy Father     Social History Social History   Tobacco Use  . Smoking status: Current Every Day Smoker    Packs/day: 1.00  . Smokeless tobacco: Never Used  Substance Use Topics  . Alcohol use: No    Comment: Patient denies  . Drug use: Yes    Types: Marijuana    Comment: Patient reports she occasionally uses THC     Allergies   Patient has no known allergies.   Review of Systems Review of Systems   Physical Exam Triage Vital Signs ED Triage Vitals  Enc Vitals Group     BP 09/11/18 1201 112/73     Pulse Rate 09/11/18 1201 (!) 106     Resp --  Temp 09/11/18 1201 98.3 F (36.8 C)     Temp src --      SpO2 09/11/18 1201 100 %     Weight --      Height --      Head Circumference --      Peak Flow --      Pain Score 09/11/18 1202 8     Pain Loc --      Pain Edu? --      Excl. in GC? --    No data found.  Updated Vital Signs BP 112/73   Pulse (!) 106   Temp 98.3 F (36.8 C)   LMP 09/07/2018   SpO2 100%   Visual Acuity Right Eye Distance:   Left Eye Distance:   Bilateral Distance:    Right Eye Near:   Left Eye Near:    Bilateral Near:     Physical Exam Vitals signs and nursing note reviewed.  Constitutional:      General: She is not in acute distress.    Appearance: Normal appearance. She is not ill-appearing, toxic-appearing or diaphoretic.  HENT:     Head: Normocephalic and atraumatic.     Nose: Nose normal.  Neck:     Musculoskeletal: Normal range of motion.  Pulmonary:     Effort: Pulmonary effort is normal.  Genitourinary:      Comments: Small pea size cyst to the left buttocks. Painful to touch. Mild erythema. No fluctuance  or surrounding induration.    Musculoskeletal: Normal range of motion.  Skin:    General: Skin is warm and dry.  Neurological:     Mental Status: She is alert.  Psychiatric:        Mood and Affect: Mood normal.      UC Treatments / Results  Labs (all labs ordered are listed, but only abnormal results are displayed) Labs Reviewed - No data to display  EKG None  Radiology No results found.  Procedures Procedures (including critical care time)  Medications Ordered in UC Medications - No data to display  Initial Impression / Assessment and Plan / UC Course  I have reviewed the triage vital signs and the nursing notes.  Pertinent labs & imaging results that were available during my care of the patient were reviewed by me and considered in my medical decision making (see chart for details).    Infected cyst  Pt did not want I&D Used topical numbing spray Made small puncture in the center of cyst  Expressed cheesy substance  Pt feeling much better after procedure Told to finish abx and continue the warm compresses Follow up as needed for continued or worsening symptoms   Final Clinical Impressions(s) / UC Diagnoses   Final diagnoses:  Abscess     Discharge Instructions     Continue the bactrim as prescribed Keep doing the warm compresses.     ED Prescriptions    None     Controlled Substance Prescriptions [place] Controlled Substance Registry consulted? Not Applicable   Janace Aris, NP 09/12/18 [phone]

## 2018-09-26 ENCOUNTER — Other Ambulatory Visit: Payer: Self-pay

## 2018-09-26 ENCOUNTER — Encounter (HOSPITAL_COMMUNITY): Payer: Self-pay

## 2018-09-26 ENCOUNTER — Ambulatory Visit (HOSPITAL_COMMUNITY)
Admission: EM | Admit: 2018-09-26 | Discharge: 2018-09-26 | Disposition: A | Discharge status: Home / Self Care | Payer: Medicaid Other | Attending: Family Medicine | Admitting: Family Medicine

## 2018-09-26 DIAGNOSIS — H0011 Chalazion right upper eyelid: Secondary | ICD-10-CM | POA: Insufficient documentation

## 2018-09-26 DIAGNOSIS — R110 Nausea: Secondary | ICD-10-CM

## 2018-09-26 DIAGNOSIS — Z3201 Encounter for pregnancy test, result positive: Secondary | ICD-10-CM | POA: Diagnosis not present

## 2018-09-26 DIAGNOSIS — N6459 Other signs and symptoms in breast: Secondary | ICD-10-CM | POA: Diagnosis not present

## 2018-09-26 LAB — POCT PREGNANCY, URINE: Preg Test, Ur: POSITIVE — AB

## 2018-09-26 LAB — HCG, QUANTITATIVE, PREGNANCY: hCG, Beta Chain, Quant, S: 24 m[IU]/mL — ABNORMAL HIGH (ref <5)

## 2018-09-26 MED ORDER — DOXYLAMINE-PYRIDOXINE 10-10 MG PO TBEC: 2.0000 | DELAYED_RELEASE_TABLET | Freq: Every evening | ORAL | 0 refills | Status: DC | PRN

## 2018-09-26 NOTE — ED Triage Notes (Signed)
Pt presents for possible pregnancy and swelling in the right eye.

## 2018-09-26 NOTE — Discharge Instructions (Signed)
Please read attached about chalazion. These typically do not require antibiotics.   Pregnancy test was positive, we will draw blood work to confirm. Please follow-up with OB/GYN Begin prenatal vitamin Please read attached information on prenatal care  Follow-up if developing increased eye swelling, redness pain, changes in vision. Please [facility]-up with [facility] or emergency room if developing abdominal pain or vaginal bleeding.

## 2018-09-26 NOTE — ED Provider Notes (Addendum)
[facility]    CSN: [id number] Arrival date & time: 09/26/18  1412     History   Chief Complaint Chief Complaint  Patient presents with  . Eye Problem  . Possible Pregnancy    HPI Casey Duncan is a 38 y.o. female no significant past medical history presenting today for evaluation of possible pregnancy as well as right eye swelling.  Patient is mainly concerned about possible pregnancy as she notes that her last menstrual cycle was early April and is due to begin cycle tomorrow.  She typically has regular cycles.  She notes that over the past couple weeks she has had nausea, sensitivity to certain foods and smells as well as breast tenderness which feels similar to when she was previously pregnant.  She has taken 2 home tests which have had a faint positive line.  Denies abdominal pain.  She is also noted swelling to her right upper and lower lid.  Swelling on upper lid began approximately 3 weeks ago.  Swelling on lower lid began 2 to 3 days ago.  She has been on clindamycin as well as Bactrim.  Did not have improvement with clindamycin, felt like symptoms were improving with Bactrim but ended up losing prescription as she went out of town.  She denies any current drainage, feels lesion on upper lid was draining previously.  Denies changes in vision, itching or irritation.  Denies wearing contacts.  Denies pain associated with lesions.  HPI  Past Medical History:  Diagnosis Date  . Abnormal vaginal bleeding   . No pertinent past medical history     There are no active problems to display for this patient.   Past Surgical History:  Procedure Laterality Date  . BREAST SURGERY    . vaginal rejuvenation      OB History    Gravida  3   Para  3   Term  3   Preterm      AB  0   Living  2     SAB      TAB      Ectopic      Multiple      Live Births               Home Medications    Prior to Admission medications   Medication Sig Start Date  End Date Taking? Authorizing Provider  clindamycin (CLEOCIN) 300 MG capsule Take 1 capsule (300 mg total) by mouth 3 (three) times daily. 09/01/18   Lattie Haw, MD  diphenhydrAMINE (BENADRYL) 25 mg capsule Take 25 mg by mouth every 6 (six) hours as needed for itching.    [provider]  Doxylamine-Pyridoxine 10-10 MG TBEC Take 2 tablets by mouth at bedtime as needed. 09/26/18   Tae Vonada C, PA-C  erythromycin ophthalmic ointment Place a 1/2 inch ribbon of ointment into the lower eyelid 3 times daily for 7 days 08/20/18   Lurene Shadow, PA-C  fluconazole (DIFLUCAN) 150 MG tablet Take 1 tablet (150 mg total) by mouth once a week. Once sx begin 09/07/18   Wallis Bamberg, PA-C  norgestimate-ethinyl estradiol (SPRINTEC 28) 0.25-35 MG-MCG tablet Take 1 tablet by mouth daily. 07/01/18   Lamptey, Britta Mccreedy, MD  sulfamethoxazole-trimethoprim (BACTRIM DS,SEPTRA DS) 800-160 MG tablet Take 1 tablet by mouth 2 (two) times daily. 09/07/18   Wallis Bamberg, PA-C    Family History Family History  Problem Relation Age of Onset  . Healthy Mother   .  Healthy Father     Social History Social History   Tobacco Use  . Smoking status: Current Every Day Smoker    Packs/day: 1.00  . Smokeless tobacco: Never Used  Substance Use Topics  . Alcohol use: No    Comment: Patient denies  . Drug use: Yes    Types: Marijuana    Comment: Patient reports she occasionally uses THC     Allergies   Patient has no known allergies.   Review of Systems Review of Systems  Constitutional: Negative for activity change, appetite change and fever.  HENT: Negative for facial swelling.   Eyes: Negative for pain, discharge, redness, itching and visual disturbance.  Respiratory: Negative for shortness of breath.   Cardiovascular: Negative for chest pain.  Gastrointestinal: Positive for nausea. Negative for abdominal pain, diarrhea and vomiting.  Genitourinary: Negative for dysuria, flank pain, genital sores,  hematuria, menstrual problem, vaginal bleeding, vaginal discharge and vaginal pain.  Musculoskeletal: Negative for back pain.  Skin: Negative for rash.  Neurological: Negative for dizziness, light-headedness and headaches.     Physical Exam Triage Vital Signs ED Triage Vitals  Enc Vitals Group     BP 09/26/18 1436 104/67     Pulse Rate 09/26/18 1436 96     Resp 09/26/18 1436 18     Temp 09/26/18 1436 98.3 F (36.8 C)     Temp Source 09/26/18 1436 Oral     SpO2 09/26/18 1436 96 %     Weight --      Height --      Head Circumference --      Peak Flow --      Pain Score 09/26/18 1439 0     Pain Loc --      Pain Edu? --      Excl. in GC? --    No data found.  Updated Vital Signs BP 104/67 (BP Location: Left Arm)   Pulse 96   Temp 98.3 F (36.8 C) (Oral)   Resp 18   LMP 09/07/2018   SpO2 96%   Visual Acuity Right Eye Distance:   Left Eye Distance:   Bilateral Distance:    Right Eye Near:   Left Eye Near:    Bilateral Near:     Physical Exam Vitals signs and nursing note reviewed.  Constitutional:      General: She is not in acute distress.    Appearance: She is well-developed.  HENT:     Head: Normocephalic and atraumatic.  Eyes:     Extraocular Movements: Extraocular movements intact.     Conjunctiva/sclera: Conjunctivae normal.     Pupils: Pupils are equal, round, and reactive to light.     Comments: Right upper and lower lid with small 0.5 cm circular lesions that are hard without overlying erythema or tenderness, and no drainage noted  Bilateral conjunctive a without erythema  Neck:     Musculoskeletal: Neck supple.  Cardiovascular:     Rate and Rhythm: Normal rate and regular rhythm.     Heart sounds: No murmur.  Pulmonary:     Effort: Pulmonary effort is normal. No respiratory distress.     Breath sounds: Normal breath sounds.  Abdominal:     Palpations: Abdomen is soft.     Tenderness: There is no abdominal tenderness.     Comments: Nontender  to light and deep palpation throughout abdomen  Skin:    General: Skin is warm and dry.  Neurological:     Mental  Status: She is alert.      UC Treatments / Results  Labs (all labs ordered are listed, but only abnormal results are displayed) Labs Reviewed  POCT PREGNANCY, URINE - Abnormal; Notable for the following components:      Result Value   Preg Test, Ur POSITIVE (*)    All other components within normal limits  HCG, QUANTITATIVE, PREGNANCY  POC URINE PREG, ED    EKG None  Radiology No results found.  Procedures Procedures (including critical care time)  Medications Ordered in UC Medications - No data to display  Initial Impression / Assessment and Plan / UC Course  I have reviewed the triage vital signs and the nursing notes.  Pertinent labs & imaging results that were available during my care of the patient were reviewed by me and considered in my medical decision making (see chart for details).     Swelling on her eyes more suggestive of chalazion versus a hordeolum given lacking pain and previous antibiotics without improvement.  Will recommend continuing anti-inflammatories and warm compresses.  Discussed these often may take time, follow-up with ophthalmology if symptoms persisting.  Follow-up if worsening.  Pregnancy test faintly positive, repeated twice, most likely cause, will check hCG to ensure.  Follow-up with OB/GYN, begin prenatal, discussed prenatal care.  Discussed strict return precautions. Patient verbalized understanding and is agreeable with plan.  Final Clinical Impressions(s) / UC Diagnoses   Final diagnoses:  Positive pregnancy test  Chalazion of right upper eyelid     Discharge Instructions     Please read attached about chalazion. These typically do not require antibiotics.   Pregnancy test was positive, we will draw blood work to confirm. Please follow-up with OB/GYN Begin prenatal vitamin Please read attached information on  prenatal care  Follow-up if developing increased eye swelling, redness pain, changes in vision. Please [facility] with [facility] or emergency room if developing abdominal pain or vaginal bleeding.      ED Prescriptions    Medication Sig Dispense Auth. Provider   Doxylamine-Pyridoxine 10-10 MG TBEC Take 2 tablets by mouth at bedtime as needed. 60 tablet Dayten Juba, [place] C, PA-C     Controlled Substance Prescriptions [place] Controlled Substance Registry consulted? Not Applicable   Lew Dawes, PA-C 09/26/18 1547    Pelham Hennick, G[place] C, PA-C 09/26/18 1550

## 2018-10-13 ENCOUNTER — Encounter (HOSPITAL_COMMUNITY): Payer: Self-pay

## 2018-10-13 ENCOUNTER — Ambulatory Visit (HOSPITAL_COMMUNITY)
Admission: EM | Admit: 2018-10-13 | Discharge: 2018-10-13 | Disposition: A | Discharge status: Home / Self Care | Payer: Medicaid Other | Attending: Family Medicine | Admitting: Family Medicine

## 2018-10-13 ENCOUNTER — Other Ambulatory Visit: Payer: Self-pay

## 2018-10-13 DIAGNOSIS — H00012 Hordeolum externum right lower eyelid: Secondary | ICD-10-CM

## 2018-10-13 MED ORDER — POLYMYXIN B-TRIMETHOPRIM 10000-0.1 UNIT/ML-% OP SOLN: 2.0000 [drp] | OPHTHALMIC | 0 refills | Status: DC

## 2018-10-13 NOTE — ED Provider Notes (Signed)
[facility]    CSN: [id number] Arrival date & time: 10/13/18  1527     History   Chief Complaint Chief Complaint  Patient presents with  . Eye Problem    HPI Casey Duncan is a 38 y.o. female.   HPI  Here for follow-up of her hordeolum.  She has a stop taking her antibiotics because she is pregnant.  She is on a sulfa antibiotic.  She wants to what else she can do for her eye.  I told her we can change her to an eyedrop.  She wants referral to an eye doctor.  I told her she has to do that through her primary care doctor, PCP or GYN.  Past Medical History:  Diagnosis Date  . Abnormal vaginal bleeding   . No pertinent past medical history     There are no active problems to display for this patient.   Past Surgical History:  Procedure Laterality Date  . BREAST SURGERY    . vaginal rejuvenation      OB History    Gravida  4   Para  3   Term  3   Preterm      AB  0   Living  2     SAB      TAB      Ectopic      Multiple      Live Births               Home Medications    Prior to Admission medications   Medication Sig Start Date End Date Taking? Authorizing Provider  diphenhydrAMINE (BENADRYL) 25 mg capsule Take 25 mg by mouth every 6 (six) hours as needed for itching.    [provider]  trimethoprim-polymyxin b (POLYTRIM) ophthalmic solution Place 2 drops into the right eye every 4 (four) hours. 10/13/18   Eustace MooreNelson, Osei Anger Sue, MD    Family History Family History  Problem Relation Age of Onset  . Healthy Mother   . Healthy Father     Social History Social History   Tobacco Use  . Smoking status: Current Every Day Smoker    Packs/day: 0.50  . Smokeless tobacco: Never Used  Substance Use Topics  . Alcohol use: No    Comment: Patient denies  . Drug use: Yes    Types: Marijuana    Comment: Patient reports she occasionally uses THC     Allergies   Patient has no known allergies.   Review of Systems  Review of Systems  Constitutional: Negative for chills and fever.  HENT: Negative for ear pain and sore throat.   Eyes: Positive for redness. Negative for pain and visual disturbance.  Respiratory: Negative for cough and shortness of breath.   Cardiovascular: Negative for chest pain and palpitations.  Gastrointestinal: Negative for abdominal pain and vomiting.  Genitourinary: Negative for dysuria and hematuria.  Musculoskeletal: Negative for arthralgias and back pain.  Skin: Negative for color change and rash.  Neurological: Negative for seizures and syncope.  All other systems reviewed and are negative.    Physical Exam Triage Vital Signs ED Triage Vitals  Enc Vitals Group     BP 10/13/18 1606 109/83     Pulse Rate 10/13/18 1606 92     Resp 10/13/18 1606 18     Temp 10/13/18 1606 97.8 F (36.6 C)     Temp Source 10/13/18 1606 Oral     SpO2 10/13/18 1606 98 %  No data found.  Updated Vital Signs BP 109/83 (BP Location: Right Arm)   Pulse 92   Temp 97.8 F (36.6 C) (Oral)   Resp 18   LMP 09/07/2018   SpO2 98%      Physical Exam Constitutional:      General: She is not in acute distress.    Appearance: She is well-developed.  HENT:     Head: Normocephalic and atraumatic.  Eyes:     Conjunctiva/sclera: Conjunctivae normal.     Pupils: Pupils are equal, round, and reactive to light.   Neck:     Musculoskeletal: Normal range of motion.  Cardiovascular:     Rate and Rhythm: Normal rate.  Pulmonary:     Effort: Pulmonary effort is normal. No respiratory distress.  Abdominal:     General: There is no distension.     Palpations: Abdomen is soft.  Musculoskeletal: Normal range of motion.  Skin:    General: Skin is warm and dry.  Neurological:     Mental Status: She is alert.      UC Treatments / Results  Labs (all labs ordered are listed, but only abnormal results are displayed) Labs Reviewed - No data to display  EKG None  Radiology No results  found.  Procedures Procedures (including critical care time)  Medications Ordered in UC Medications - No data to display  Initial Impression / Assessment and Plan / UC Course  I have reviewed the triage vital signs and the nursing notes.  Pertinent labs & imaging results that were available during my care of the patient were reviewed by me and considered in my medical decision making (see chart for details).      Final Clinical Impressions(s) / UC Diagnoses   Final diagnoses:  Hordeolum externum of right lower eyelid     Discharge Instructions     Use new eye drops Continue the warm compresses Need to see eye doctor You need a referral ( I cannot do, must be one of your primary care doctors)   ED Prescriptions    Medication Sig Dispense Auth. Provider   trimethoprim-polymyxin b (POLYTRIM) ophthalmic solution Place 2 drops into the right eye every 4 (four) hours. 10 mL Eustace Moore, MD     Controlled Substance Prescriptions [place] Controlled Substance Registry consulted? Not Applicable   Eustace Moore, MD 10/13/18 2114

## 2018-10-13 NOTE — Discharge Instructions (Addendum)
Use new eye drops Continue the warm compresses Need to see eye doctor You need a referral ( I cannot do, must be one of your primary care doctors)

## 2018-10-13 NOTE — ED Triage Notes (Signed)
Patient presents to Urgent Care with complaints of ongoing styes on her right eye since three months ago. Patient reports she just found out she is pregnant and was not able to continue taking the Bactrim she was on. Pt is curious if a different antibiotic would be possible.

## 2018-11-03 LAB — OB RESULTS CONSOLE HIV ANTIBODY (ROUTINE TESTING): HIV: NONREACTIVE

## 2018-11-03 LAB — OB RESULTS CONSOLE RUBELLA ANTIBODY, IGM: Rubella: IMMUNE

## 2018-11-03 LAB — OB RESULTS CONSOLE HEPATITIS B SURFACE ANTIGEN: Hepatitis B Surface Ag: NEGATIVE

## 2018-11-16 ENCOUNTER — Inpatient Hospital Stay (HOSPITAL_COMMUNITY): Payer: Medicaid Other

## 2018-11-16 ENCOUNTER — Encounter (HOSPITAL_COMMUNITY): Payer: Self-pay

## 2018-11-16 ENCOUNTER — Other Ambulatory Visit: Payer: Self-pay

## 2018-11-16 ENCOUNTER — Inpatient Hospital Stay (HOSPITAL_COMMUNITY)
Admission: AD | Admit: 2018-11-16 | Discharge: 2018-11-16 | Disposition: A | Discharge status: Home / Self Care | Payer: Medicaid Other | Attending: Obstetrics & Gynecology | Admitting: Obstetrics & Gynecology

## 2018-11-16 DIAGNOSIS — O029 Abnormal product of conception, unspecified: Secondary | ICD-10-CM | POA: Diagnosis present

## 2018-11-16 DIAGNOSIS — R110 Nausea: Secondary | ICD-10-CM | POA: Diagnosis not present

## 2018-11-16 DIAGNOSIS — N939 Abnormal uterine and vaginal bleeding, unspecified: Secondary | ICD-10-CM | POA: Insufficient documentation

## 2018-11-16 DIAGNOSIS — R109 Unspecified abdominal pain: Secondary | ICD-10-CM | POA: Insufficient documentation

## 2018-11-16 DIAGNOSIS — F17210 Nicotine dependence, cigarettes, uncomplicated: Secondary | ICD-10-CM | POA: Diagnosis not present

## 2018-11-16 DIAGNOSIS — O26891 Other specified pregnancy related conditions, first trimester: Secondary | ICD-10-CM | POA: Insufficient documentation

## 2018-11-16 DIAGNOSIS — O99331 Smoking (tobacco) complicating pregnancy, first trimester: Secondary | ICD-10-CM | POA: Insufficient documentation

## 2018-11-16 DIAGNOSIS — Z3A10 10 weeks gestation of pregnancy: Secondary | ICD-10-CM

## 2018-11-16 DIAGNOSIS — O209 Hemorrhage in early pregnancy, unspecified: Secondary | ICD-10-CM | POA: Diagnosis not present

## 2018-11-16 LAB — URINALYSIS, ROUTINE W REFLEX MICROSCOPIC
Bacteria, UA: NONE SEEN
Bilirubin Urine: NEGATIVE
Glucose, UA: NEGATIVE mg/dL
Ketones, ur: NEGATIVE mg/dL
Leukocytes,Ua: NEGATIVE
Nitrite: NEGATIVE
Protein, ur: NEGATIVE mg/dL
Specific Gravity, Urine: 1.021 (ref 1.005–1.030)
pH: 6 (ref 5.0–8.0)

## 2018-11-16 LAB — WET PREP, GENITAL
Clue Cells Wet Prep HPF POC: NONE SEEN
Sperm: NONE SEEN
Trich, Wet Prep: NONE SEEN
Yeast Wet Prep HPF POC: NONE SEEN

## 2018-11-16 LAB — CBC
HCT: 32.8 % — ABNORMAL LOW (ref 36.0–46.0)
Hemoglobin: 10.9 g/dL — ABNORMAL LOW (ref 12.0–15.0)
MCH: 29.9 pg (ref 26.0–34.0)
MCHC: 33.2 g/dL (ref 30.0–36.0)
MCV: 90.1 fL (ref 80.0–100.0)
Platelets: 346 10*3/uL (ref 150–400)
RBC: 3.64 MIL/uL — ABNORMAL LOW (ref 3.87–5.11)
RDW: 13.5 % (ref 11.5–15.5)
WBC: 11.2 10*3/uL — ABNORMAL HIGH (ref 4.0–10.5)
nRBC: 0 % (ref 0.0–0.2)

## 2018-11-16 LAB — OB RESULTS CONSOLE GC/CHLAMYDIA: Gonorrhea: NEGATIVE

## 2018-11-16 NOTE — MAU Note (Signed)
Casey Duncan is a 38 y.o. at [redacted]w[redacted]d here in MAU reporting: bleeding for 2 days, has also been cramping and that got more intense today. Notices bleeding when wiping after using the bathroom. Nausea since today. No vomiting. No abnormal discharge. Last IC was Friday.   Onset of complaint: 2 days ago  Pain score: 6/10  Vitals:   11/16/18 1549  BP: 102/68  Pulse: (!) 106  Resp: 16  Temp: 98.2 F (36.8 C)  SpO2: 100%      Lab orders placed from triage: UA

## 2018-11-16 NOTE — MAU Provider Note (Signed)
History     CSN: [id number]  Arrival date and time: 11/16/18 1520   First Provider Initiated Contact with Patient 11/16/18 1613      Chief Complaint  Patient presents with  . Abdominal Pain  . Vaginal Bleeding  . Nausea   HPI Casey Duncan 38 y.o. [id number]  Comes to MAU with vaginal bleeding in her first trimester.  Has had ultrasounds done out of the system in this pregnancy.  Was told there was a heart rate and she got a due date.  Unable to hear FHT with doppler today and client is very upset.  She has had bleeding when she wiped for 2 days after intercourse.  Today she has had cramping and no cramping yesterday.  She requests STD testing today.  Has had vaginal rejuvenation surgery ( in [place] 2 years ago) and requests no speculum exam but is fine with a transvaginal ultrasound.  [facility] - has not been seen there yet.  Has had 2 ultrasounds outside of [place].  Results not seen in Care Everywhere.  OB History    Gravida  4   Para  3   Term  3   Preterm      AB  0   Living  2     SAB      TAB      Ectopic      Multiple      Live Births              Past Medical History:  Diagnosis Date  . Abnormal vaginal bleeding   . Medical history non-contributory   . No pertinent past medical history     Past Surgical History:  Procedure Laterality Date  . BREAST SURGERY    . vaginal rejuvenation      Family History  Problem Relation Age of Onset  . Healthy Mother   . Healthy Father     Social History   Tobacco Use  . Smoking status: Current Every Day Smoker    Packs/day: 0.50  . Smokeless tobacco: Never Used  Substance Use Topics  . Alcohol use: No    Comment: Patient denies  . Drug use: Yes    Types: Marijuana    Comment: Patient reports she occasionally uses THC    Allergies: No Known Allergies  Medications Prior to Admission  Medication Sig Dispense Refill Last Dose  . diphenhydrAMINE  (BENADRYL) 25 mg capsule Take 25 mg by mouth every 6 (six) hours as needed for itching.     . trimethoprim-polymyxin b (POLYTRIM) ophthalmic solution Place 2 drops into the right eye every 4 (four) hours. 10 mL 0     Review of Systems  Constitutional: Negative for fever.  Respiratory: Negative for cough and shortness of breath.   Gastrointestinal:       Small amount of cramping  Genitourinary: Positive for vaginal bleeding. Negative for dysuria and vaginal discharge.   Physical Exam   Blood pressure 102/68, pulse (!) 106, temperature 98.2 F (36.8 C), temperature source Oral, resp. rate 16, height 5\' 8"  (1.727 m), weight 91.1 kg, last menstrual period 09/04/2018, SpO2 100 %.  Physical Exam  Nursing note and vitals reviewed. Constitutional: She is oriented to person, place, and time. She appears well-developed and well-nourished.  HENT:  Head: Normocephalic.  Eyes: EOM are normal.  Neck: Neck supple.  GI: Soft. There is no abdominal tenderness. There is no rebound and no guarding.  Genitourinary:    Genitourinary Comments: Vaginal swabs done.  No bleeding seen.   Musculoskeletal: Normal range of motion.  Neurological: She is alert and oriented to person, place, and time.  Skin: Skin is warm and dry.  Psychiatric: She has a normal mood and affect.    MAU Course  Procedures Results for orders placed or performed during the hospital encounter of 11/16/18 (from the past 24 hour(s))  Urinalysis, Routine w reflex microscopic     Status: Abnormal   Collection Time: 11/16/18  3:57 PM  Result Value Ref Range   Color, Urine YELLOW YELLOW   APPearance HAZY (A) CLEAR   Specific Gravity, Urine 1.021 1.005 - 1.030   pH 6.0 5.0 - 8.0   Glucose, UA NEGATIVE NEGATIVE mg/dL   Hgb urine dipstick SMALL (A) NEGATIVE   Bilirubin Urine NEGATIVE NEGATIVE   Ketones, ur NEGATIVE NEGATIVE mg/dL   Protein, ur NEGATIVE NEGATIVE mg/dL   Nitrite NEGATIVE NEGATIVE   Leukocytes,Ua NEGATIVE NEGATIVE    RBC / HPF 11-20 0 - 5 RBC/hpf   WBC, UA 0-5 0 - 5 WBC/hpf   Bacteria, UA NONE SEEN NONE SEEN   Squamous Epithelial / LPF 6-10 0 - 5   Mucus PRESENT   Wet prep, genital     Status: Abnormal   Collection Time: 11/16/18  4:19 PM   Specimen: Cervical/Vaginal swab  Result Value Ref Range   Yeast Wet Prep HPF POC NONE SEEN NONE SEEN   Trich, Wet Prep NONE SEEN NONE SEEN   Clue Cells Wet Prep HPF POC NONE SEEN NONE SEEN   WBC, Wet Prep HPF POC MODERATE (A) NONE SEEN   Sperm NONE SEEN   CBC     Status: Abnormal   Collection Time: 11/16/18  4:32 PM  Result Value Ref Range   WBC 11.2 (H) 4.0 - 10.5 K/uL   RBC 3.64 (L) 3.87 - 5.11 MIL/uL   Hemoglobin 10.9 (L) 12.0 - 15.0 g/dL   HCT 32.8 (L) 36.0 - 46.0 %   MCV 90.1 80.0 - 100.0 fL   MCH 29.9 26.0 - 34.0 pg   MCHC 33.2 30.0 - 36.0 g/dL   RDW 13.5 11.5 - 15.5 %   Platelets 346 150 - 400 K/uL   nRBC 0.0 0.0 - 0.2 %    MDM Client crying and distressed on the phone.  Asked her to be off the phone to get her ultrasound results.  Still crying heavily.  Preliminary [place] results reviewed.  Was able to pay attention to the explanation of the subchorionic hemorrhage and  instructions.  Did not voice any questions.  In a hurry to leave due to "I have a lot going on".  Almost left AMA.  Asked if there was anything we could do to help her right now and she said no.  Advised to wait in the car until she was not crying to begin driving.  Assessment and Plan  Vaginal bleeding in first trimester due to subchorionic hemorrhage  Plan There is a subchorionic hemorrhage which may cause additional bleeding or may resolve on it's own. The baby has a heart beat and looks good. No sex right now. Keep the appointment you have at [facility] to begin prenatal care. Mozel Burdett L Long [place]image 11/16/2018, 4:22 PM

## 2018-11-16 NOTE — Discharge Instructions (Signed)
There is a subchorionic hemorrhage which may cause additional bleeding or may resolve on it's own. The baby has a heart beat and looks good. No sex right now. Keep the appointment at [facility]

## 2018-11-16 NOTE — MAU Note (Signed)
Patient proceeds to remain on facetime with an unknown individual. Patient desires door to remain open and is loudly crying. RN inquired about what was going on. She states she needs to leave because she has a lot going on. RN overhead patient telling person on the phone " I am just trying to talk to you about what is going on with me. Why are you treating me like this?".  Terri NP to the bedside to discuss results so that patient can be discharged home. Terri aware of discharge BP and states that it is ok in association with patient being upset.

## 2018-11-17 LAB — GC/CHLAMYDIA PROBE AMP (~~LOC~~) NOT AT ARMC
Chlamydia: NEGATIVE
Neisseria Gonorrhea: NEGATIVE

## 2018-11-17 LAB — HIV ANTIBODY (ROUTINE TESTING W REFLEX): HIV Screen 4th Generation wRfx: NONREACTIVE

## 2018-11-17 LAB — RPR: RPR Ser Ql: NONREACTIVE

## 2018-12-03 ENCOUNTER — Ambulatory Visit: Payer: Self-pay | Admitting: *Deleted

## 2018-12-03 DIAGNOSIS — Z20822 Contact with and (suspected) exposure to covid-19: Secondary | ICD-10-CM

## 2018-12-03 NOTE — Telephone Encounter (Signed)
3 months pregnant. Right arm bicep with a dull muscle ache intermittently that started yesterday. Once yesterday and again today lasted about 20 minutes. Noticed she gets winded with walking upstairs yesterday. Her mother tested [phone] positive yesterday.No fever. Smoker's cough. No other symptoms. She's concerned bc her son and husband were around her positive mother then came home. Reviewed wearing mask while out of the home. Frequent hand washing. Stay home, attempt to decrease smoking. She is under the care of obgyn at [facility].She will call obgyn with any further questions/concerns/symptoms.  Reason for Disposition . [1] COVID-19 infection suspected by caller or triager AND [2] mild symptoms (cough, fever, or others) AND [8] no complications or SOB  Answer Assessment - Initial Assessment Questions 1. CLOSE CONTACT: "Who is the person with the confirmed or suspected COVID-19 infection that you were exposed to?"     Her husband and son had contact with her mother who tested positive yesterday. 2. PLACE of CONTACT: "Where were you when you were exposed to COVID-19?" (e.g., home, school, medical waiting room; which city?)     Home 3. TYPE of CONTACT: "How much contact was there?" (e.g., sitting next to, live in same house, work in same office, same building)     minimal 4. DURATION of CONTACT: "How long were you in contact with the COVID-19 patient?" (e.g., a few seconds, passed by person, a few minutes, live with the patient)    Person to person 5. DATE of CONTACT: "When did you have contact with a COVID-19 patient?" (e.g., how many days ago)      6. TRAVEL: "Have you traveled out of the country recently?" If so, "When and where?"no      * Also ask about out-of-state travel, since the CDC has identified some high-risk cities for community spread in the [place].     * Note: Travel becomes less relevant if there is widespread community transmission where the patient lives.      7. COMMUNITY  SPREAD: "Are there lots of cases of COVID-19 (community spread) where you live?" (See public health department website, if unsure)       8. SYMPTOMS: "Do you have any symptoms?" (e.g., fever, cough, breathing difficulty)     Has to breath in deeply. Thinks this is her nerves. 9. PREGNANCY OR POSTPARTUM: "Is there any chance you are pregnant?" "When was your last menstrual period?" "Did you deliver in the last 2 weeks?"     Yes, 3 months 10. HIGH RISK: "Do you have any heart or lung problems? Do you have a weak immune system?" (e.g., CHF, COPD, asthma, HIV positive, chemotherapy, renal failure, diabetes mellitus, sickle cell anemia)      none  Protocols used: CORONAVIRUS (COVID-19) DIAGNOSED OR SUSPECTED-A-AH, CORONAVIRUS (COVID-19) EXPOSURE-A-AH

## 2018-12-03 NOTE — Telephone Encounter (Signed)
Due to high risk, covid19 testing appointment made for 7/9 at 11:30a at the [facility] site. Informed to wear mask and stay in vehicle.

## 2018-12-04 ENCOUNTER — Other Ambulatory Visit: Payer: Medicaid Other

## 2018-12-04 ENCOUNTER — Telehealth: Payer: Self-pay | Admitting: *Deleted

## 2018-12-05 ENCOUNTER — Other Ambulatory Visit: Payer: Medicaid Other

## 2018-12-09 LAB — NOVEL CORONAVIRUS, NAA: SARS-CoV-2, NAA: DETECTED — AB

## 2018-12-09 NOTE — Telephone Encounter (Signed)
err

## 2018-12-10 ENCOUNTER — Emergency Department (HOSPITAL_COMMUNITY)
Admission: EM | Admit: 2018-12-10 | Discharge: 2018-12-10 | Disposition: A | Discharge status: Home / Self Care | Payer: Medicaid Other | Attending: Emergency Medicine | Admitting: Emergency Medicine

## 2018-12-10 ENCOUNTER — Other Ambulatory Visit: Payer: Self-pay

## 2018-12-10 DIAGNOSIS — O26892 Other specified pregnancy related conditions, second trimester: Secondary | ICD-10-CM | POA: Diagnosis present

## 2018-12-10 DIAGNOSIS — Z3A14 14 weeks gestation of pregnancy: Secondary | ICD-10-CM | POA: Insufficient documentation

## 2018-12-10 DIAGNOSIS — F17210 Nicotine dependence, cigarettes, uncomplicated: Secondary | ICD-10-CM | POA: Insufficient documentation

## 2018-12-10 DIAGNOSIS — N898 Other specified noninflammatory disorders of vagina: Secondary | ICD-10-CM

## 2018-12-10 LAB — URINALYSIS, ROUTINE W REFLEX MICROSCOPIC
Bilirubin Urine: NEGATIVE
Glucose, UA: NEGATIVE mg/dL
Ketones, ur: NEGATIVE mg/dL
Leukocytes,Ua: NEGATIVE
Nitrite: NEGATIVE
Protein, ur: NEGATIVE mg/dL
Specific Gravity, Urine: 1.015 (ref 1.005–1.030)
pH: 6 (ref 5.0–8.0)

## 2018-12-10 LAB — WET PREP, GENITAL
Clue Cells Wet Prep HPF POC: NONE SEEN
Sperm: NONE SEEN
Trich, Wet Prep: NONE SEEN
Yeast Wet Prep HPF POC: NONE SEEN

## 2018-12-10 NOTE — ED Provider Notes (Signed)
[facility] EMERGENCY DEPARTMENT Provider Note   CSN: [id number] Arrival date & time: 12/10/18  1729    History   Chief Complaint No chief complaint on file.   HPI Casey Duncan is a 38 y.o. G19P3 [redacted]w[redacted]d female presenting froe valuation of acute onset, persistent abnormal vaginal discharge for 1 week. She reports mild vaginal itching, denies abnormal bleeding or pain.  She denies abdominal pain, nausea, vomiting, urinary symptoms.  She is currently sexually active with one female partner but does not use protection.  Has not tried anything for her symptoms.  She is requesting STD testing.  Of note, she is also COVID positive but denies any fever, cough, shortness of breath.     The history is provided by the patient.    Past Medical History:  Diagnosis Date  . Abnormal vaginal bleeding   . Medical history non-contributory   . No pertinent past medical history     There are no active problems to display for this patient.   Past Surgical History:  Procedure Laterality Date  . BREAST SURGERY    . vaginal rejuvenation       OB History    Gravida  4   Para  3   Term  3   Preterm      AB  0   Living  2     SAB      TAB      Ectopic      Multiple      Live Births               Home Medications    Prior to Admission medications   Medication Sig Start Date End Date Taking? Authorizing Provider  diphenhydrAMINE (BENADRYL) 25 mg capsule Take 25 mg by mouth every 6 (six) hours as needed for itching.    [provider]  trimethoprim-polymyxin b (POLYTRIM) ophthalmic solution Place 2 drops into the right eye every 4 (four) hours. 10/13/18   Raylene Everts, MD    Family History Family History  Problem Relation Age of Onset  . Healthy Mother   . Healthy Father     Social History Social History   Tobacco Use  . Smoking status: Current Every Day Smoker    Packs/day: 0.50  . Smokeless tobacco: Never Used  Substance Use  Topics  . Alcohol use: No    Comment: Patient denies  . Drug use: Yes    Types: Marijuana    Comment: Patient reports she occasionally uses THC     Allergies   Sulfa antibiotics   Review of Systems Review of Systems  Constitutional: Negative for chills and fever.  Respiratory: Negative for shortness of breath.   Cardiovascular: Negative for chest pain.  Gastrointestinal: Negative for abdominal pain, nausea and vomiting.  Genitourinary: Positive for vaginal discharge. Negative for dysuria, frequency, hematuria, urgency, vaginal bleeding and vaginal pain.     Physical Exam Updated Vital Signs BP (!) 133/93 (BP Location: Right Arm)   Pulse (!) 103   Temp 98.3 F (36.8 C) (Oral)   Resp 16   LMP 09/04/2018   SpO2 100%   Physical Exam Vitals signs and nursing note reviewed.  Constitutional:      General: She is not in acute distress.    Appearance: She is well-developed.  HENT:     Head: Normocephalic and atraumatic.  Eyes:     General:        Right eye: No discharge.  Left eye: No discharge.     Conjunctiva/sclera: Conjunctivae normal.  Neck:     Vascular: No JVD.     Trachea: No tracheal deviation.  Cardiovascular:     Rate and Rhythm: Normal rate and regular rhythm.  Pulmonary:     Effort: Pulmonary effort is normal.     Breath sounds: Normal breath sounds.  Abdominal:     General: Bowel sounds are normal. There is no distension.     Palpations: Abdomen is soft.     Tenderness: There is no abdominal tenderness. There is no right CVA tenderness, left CVA tenderness, guarding or rebound.  Genitourinary:    Comments: Examination performed in the presence of a chaperone.  No masses or lesions to the external genitalia.  Moderate amount of thick white discharge in the vaginal vault.  No cervical motion tenderness or adnexal tenderness.  No abnormal bleeding. Skin:    Findings: No erythema.  Neurological:     Mental Status: She is alert.  Psychiatric:         Behavior: Behavior normal.      ED Treatments / Results  Labs (all labs ordered are listed, but only abnormal results are displayed) Labs Reviewed  WET PREP, GENITAL - Abnormal; Notable for the following components:      Result Value   WBC, Wet Prep HPF POC FEW (*)    All other components within normal limits  URINALYSIS, ROUTINE W REFLEX MICROSCOPIC - Abnormal; Notable for the following components:   APPearance HAZY (*)    Hgb urine dipstick SMALL (*)    Bacteria, UA RARE (*)    All other components within normal limits  RPR  HIV ANTIBODY (ROUTINE TESTING W REFLEX)  GC/CHLAMYDIA PROBE AMP ([place] [employer]) NOT [facility]    EKG None  Radiology No results found.  Procedures Procedures (including critical care time)  Medications Ordered in ED Medications - No data to display   Initial Impression / Assessment and Plan / ED Course  I have reviewed the triage vital signs and the nursing notes.  Pertinent labs & imaging results that were available during my care of the patient were reviewed by me and considered in my medical decision making (see chart for details).        Pertinent COVID positive patient presenting for evaluation of abnormal discharge.  She is afebrile, mildly tachycardic though this is not unusual and is likely physiologic in her pregnancy.  Vital signs otherwise stable.  She is overall well-appearing.  Abdomen is soft and nontender.  Examination is not concerning for PID, TOA, ovarian torsion, or ectopic pregnancy.  Wet prep shows few WBCs, no evidence of BV, trichomoniasis, or yeast vaginitis.  UA un-concerning for UTI or nephrolithiasis.  Suspect her discharge is likely physiologic in pregnancy I have a low suspicion of STD but we obtained cultures including syphilis, gonorrhea, chlamydia, and HIV.  We discussed prophylactic treatment but she would like to hold off until her results come back so as to not expose herself to antibiotics unnecessarily.  I think  this is reasonable as she is low risk for STDs.  We did also discussed home quarantine and due to her cocaine positive status.  Also discussed following up with OB/GYN for reevaluation of any symptoms.  Discussed strict ED return precautions Pt verbalized understanding of and agreement with plan and is safe for discharge home at this time.   Final Clinical Impressions(s) / ED Diagnoses   Final diagnoses:  Vaginal discharge  ED Discharge Orders    None       Bennye AlmFawze, Heyward Douthit A, PA-C 12/10/18 1913    Raeford RazorKohut, Stephen, MD 12/12/18 1103

## 2018-12-10 NOTE — ED Notes (Signed)
Patient verbalizes understanding of discharge instructions. Opportunity for questioning and answers were provided. Armband removed by staff, pt discharged from ED.  

## 2018-12-10 NOTE — Discharge Instructions (Addendum)
Your work-up today was reassuring with no signs of yeast infection or bacterial vaginosis.  The remainder of your blood work and tests should return in the next 48 hours and you can see your results on my chart.  If you do not hear back, your results are likely negative.  Your discharge is likely normal in pregnancy but you may find it helpful to wear cotton underwear, avoid excessive moisture, keep the area clean and dry.  Continue to take your prenatal vitamins and your prenatal care with your OB/GYN.  Return to the emergency department if any concerning signs or symptoms develop such as abnormal bleeding, severe abdominal pain, fevers, or persistent vomiting.

## 2018-12-10 NOTE — ED Triage Notes (Signed)
Pt here for STD check, unprotected sex one week ago. Pt endorses thick white vaginal discharge since, denies pain.

## 2018-12-11 LAB — GC/CHLAMYDIA PROBE AMP (~~LOC~~) NOT AT ARMC
Chlamydia: NEGATIVE
Neisseria Gonorrhea: NEGATIVE

## 2018-12-11 LAB — HIV ANTIBODY (ROUTINE TESTING W REFLEX): HIV Screen 4th Generation wRfx: NONREACTIVE

## 2018-12-11 LAB — RPR: RPR Ser Ql: NONREACTIVE

## 2018-12-31 ENCOUNTER — Other Ambulatory Visit: Payer: Self-pay

## 2018-12-31 ENCOUNTER — Encounter (HOSPITAL_COMMUNITY): Payer: Self-pay

## 2018-12-31 ENCOUNTER — Emergency Department (HOSPITAL_COMMUNITY)
Admission: EM | Admit: 2018-12-31 | Discharge: 2018-12-31 | Discharge status: Against Medical Advice | Payer: Medicaid Other | Attending: Emergency Medicine | Admitting: Emergency Medicine

## 2018-12-31 DIAGNOSIS — F419 Anxiety disorder, unspecified: Secondary | ICD-10-CM | POA: Diagnosis present

## 2018-12-31 DIAGNOSIS — Z5321 Procedure and treatment not carried out due to patient leaving prior to being seen by health care provider: Secondary | ICD-10-CM | POA: Diagnosis not present

## 2018-12-31 NOTE — ED Triage Notes (Signed)
To triage via EMS.  Pt brought in for anxiety, states "extreme stress" due to "life".  Pt crying, talking on phone.   [redacted] wks pregnant G4P3.  No abd pain, leaking fluid, vaginal bleeding, or abd pain.   EMS BP 130/73 HR 109 SpO2 100%

## 2019-01-07 ENCOUNTER — Other Ambulatory Visit: Payer: Self-pay

## 2019-01-07 ENCOUNTER — Ambulatory Visit (HOSPITAL_COMMUNITY)
Admission: EM | Admit: 2019-01-07 | Discharge: 2019-01-07 | Disposition: A | Discharge status: Home / Self Care | Payer: Medicaid Other | Attending: Family Medicine | Admitting: Family Medicine

## 2019-01-07 ENCOUNTER — Encounter (HOSPITAL_COMMUNITY): Payer: Self-pay

## 2019-01-07 DIAGNOSIS — Z113 Encounter for screening for infections with a predominantly sexual mode of transmission: Secondary | ICD-10-CM | POA: Diagnosis present

## 2019-01-07 DIAGNOSIS — R42 Dizziness and giddiness: Secondary | ICD-10-CM | POA: Insufficient documentation

## 2019-01-07 MED ORDER — CETIRIZINE HCL 10 MG PO TABS: 10.0000 mg | ORAL_TABLET | Freq: Every day | ORAL | 0 refills | Status: DC

## 2019-01-07 MED ORDER — FLUTICASONE PROPIONATE 50 MCG/ACT NA SUSP: 1.0000 | Freq: Every day | NASAL | 2 refills | Status: DC

## 2019-01-07 NOTE — ED Notes (Signed)
Vaginal swab placed in lab 

## 2019-01-07 NOTE — Discharge Instructions (Addendum)
I believe that your symptoms are related to allergies.  Flonase and Zyrtec daily for symptoms Make sure you are staying hydrated and drinking plenty of fluids.  Rest Swab sent for testing and we will call you with any positive results Follow up as needed for continued or worsening symptoms

## 2019-01-07 NOTE — ED Triage Notes (Signed)
Pt presents with complaints of dizziness x 1 hour along with nausea. Patient states is worse when she sits up. No focal neuro deficits during triage. Pt is 4 1/2 months pregnant. Denies any issues with pregnancy.

## 2019-01-08 ENCOUNTER — Encounter (HOSPITAL_COMMUNITY): Payer: Self-pay

## 2019-01-08 ENCOUNTER — Other Ambulatory Visit: Payer: Self-pay

## 2019-01-08 ENCOUNTER — Inpatient Hospital Stay (HOSPITAL_COMMUNITY)
Admission: AD | Admit: 2019-01-08 | Discharge: 2019-01-08 | Disposition: A | Discharge status: Home / Self Care | Payer: No Typology Code available for payment source | Attending: Obstetrics & Gynecology | Admitting: Obstetrics & Gynecology

## 2019-01-08 DIAGNOSIS — O99332 Smoking (tobacco) complicating pregnancy, second trimester: Secondary | ICD-10-CM | POA: Diagnosis not present

## 2019-01-08 DIAGNOSIS — O9A212 Injury, poisoning and certain other consequences of external causes complicating pregnancy, second trimester: Secondary | ICD-10-CM

## 2019-01-08 DIAGNOSIS — Z6790 Unspecified blood type, Rh positive: Secondary | ICD-10-CM

## 2019-01-08 DIAGNOSIS — N898 Other specified noninflammatory disorders of vagina: Secondary | ICD-10-CM | POA: Diagnosis not present

## 2019-01-08 DIAGNOSIS — R109 Unspecified abdominal pain: Secondary | ICD-10-CM

## 2019-01-08 DIAGNOSIS — M545 Low back pain, unspecified: Secondary | ICD-10-CM

## 2019-01-08 DIAGNOSIS — F17210 Nicotine dependence, cigarettes, uncomplicated: Secondary | ICD-10-CM | POA: Insufficient documentation

## 2019-01-08 DIAGNOSIS — Z3A18 18 weeks gestation of pregnancy: Secondary | ICD-10-CM

## 2019-01-08 DIAGNOSIS — O26899 Other specified pregnancy related conditions, unspecified trimester: Secondary | ICD-10-CM

## 2019-01-08 DIAGNOSIS — O26892 Other specified pregnancy related conditions, second trimester: Secondary | ICD-10-CM | POA: Diagnosis not present

## 2019-01-08 DIAGNOSIS — L293 Anogenital pruritus, unspecified: Secondary | ICD-10-CM | POA: Insufficient documentation

## 2019-01-08 LAB — URINALYSIS, ROUTINE W REFLEX MICROSCOPIC
Bilirubin Urine: NEGATIVE
Glucose, UA: NEGATIVE mg/dL
Ketones, ur: 5 mg/dL — AB
Nitrite: NEGATIVE
Protein, ur: 30 mg/dL — AB
Specific Gravity, Urine: 1.028 (ref 1.005–1.030)
pH: 5 (ref 5.0–8.0)

## 2019-01-08 LAB — HIV ANTIBODY (ROUTINE TESTING W REFLEX): HIV Screen 4th Generation wRfx: NONREACTIVE

## 2019-01-08 LAB — HEMOGLOBIN AND HEMATOCRIT, BLOOD
HCT: 30.4 % — ABNORMAL LOW (ref 36.0–46.0)
Hemoglobin: 10.1 g/dL — ABNORMAL LOW (ref 12.0–15.0)

## 2019-01-08 LAB — RPR: RPR Ser Ql: NONREACTIVE

## 2019-01-08 LAB — WET PREP, GENITAL
Clue Cells Wet Prep HPF POC: NONE SEEN
Sperm: NONE SEEN
Trich, Wet Prep: NONE SEEN
Yeast Wet Prep HPF POC: NONE SEEN

## 2019-01-08 MED ORDER — TERCONAZOLE 0.4 % VA CREA: 1.0000 | TOPICAL_CREAM | Freq: Every day | VAGINAL | 0 refills | Status: AC
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
## 2019-01-08 NOTE — Discharge Instructions (Signed)
Safe Medications in Pregnancy    Acne: Benzoyl Peroxide Salicylic Acid  Backache/Headache: Tylenol: 2 regular strength every 4 hours OR              2 Extra strength every 6 hours  Colds/Coughs/Allergies: Benadryl (alcohol free) 25 mg every 6 hours as needed Breath right strips Claritin Cepacol throat lozenges Chloraseptic throat spray Cold-Eeze- up to three times per day Cough drops, alcohol free Flonase (by prescription only) Guaifenesin Mucinex Robitussin DM (plain only, alcohol free) Saline nasal spray/drops Sudafed (pseudoephedrine) & Actifed ** use only after [redacted] weeks gestation and if you do not have high blood pressure Tylenol Vicks Vaporub Zinc lozenges Zyrtec   Constipation: Colace Ducolax suppositories Fleet enema Glycerin suppositories Metamucil Milk of magnesia Miralax Senokot Smooth move tea  Diarrhea: Kaopectate Imodium A-D  *NO pepto Bismol  Hemorrhoids: Anusol Anusol HC Preparation H Tucks  Indigestion: Tums Maalox Mylanta Zantac  Pepcid  Insomnia: Benadryl (alcohol free) 25mg  every 6 hours as needed Tylenol PM Unisom, no Gelcaps  Leg Cramps: Tums MagGel  Nausea/Vomiting:  Bonine Dramamine Emetrol Ginger extract Sea bands Meclizine  Nausea medication to take during pregnancy:  Unisom (doxylamine succinate 25 mg tablets) Take one tablet daily at bedtime. If symptoms are not adequately controlled, the dose can be increased to a maximum recommended dose of two tablets daily (1/2 tablet in the morning, 1/2 tablet mid-afternoon and one at bedtime). Vitamin B6 100mg  tablets. Take one tablet twice a day (up to 200 mg per day).  Skin Rashes: Aveeno products Benadryl cream or 25mg  every 6 hours as needed Calamine Lotion 1% cortisone cream  Yeast infection: Gyne-lotrimin 7 Monistat 7   **If taking multiple medications, please check labels to avoid duplicating the same active ingredients **take  medication as directed on the label ** Do not exceed 4000 mg of tylenol in 24 hours **Do not take medications that contain aspirin or ibuprofen     Abdominal Pain During Pregnancy  Abdominal pain is common during pregnancy, and has many possible causes. Some causes are more serious than others, and sometimes the cause is not known. Abdominal pain can be a sign that labor is starting. It can also be caused by normal growth and stretching of muscles and ligaments during pregnancy. Always tell your health care provider if you have any abdominal pain. Follow these instructions at home:  Do not have sex or put anything in your vagina until your pain goes away completely.  Get plenty of rest until your pain improves.  Drink enough fluid to keep your urine pale yellow.  Take over-the-counter and prescription medicines only as told by your health care provider.  Keep all follow-up visits as told by your health care provider. This is important. Contact a health care provider if:  Your pain continues or gets worse after resting.  You have lower abdominal pain that: ? Comes and goes at regular intervals. ? Spreads to your back. ? Is similar to menstrual cramps.  You have pain or burning when you urinate. Get help right away if:  You have a fever or chills.  You have vaginal bleeding.  You are leaking fluid from your vagina.  You are passing tissue from your vagina.  You have vomiting or diarrhea that lasts for more than 24 hours.  Your baby is moving less than usual.  You feel very weak or faint.  You have shortness of breath.  You develop severe pain in your upper abdomen. Summary  Abdominal pain  is common during pregnancy, and has many possible causes.  If you experience abdominal pain during pregnancy, tell your health care provider right away.  Follow your health care provider's home care instructions and keep all follow-up visits as directed. This information is not  intended to replace advice given to you by your health care provider. Make sure you discuss any questions you have with your health care provider. Document Released: 05/14/2005 Document Revised: 09/01/2018 Document Reviewed: 08/16/2016 Elsevier Patient Education  [place].  Back Pain in Pregnancy Back pain during pregnancy is common. Back pain may be caused by several factors that are related to changes during your pregnancy. Follow these instructions at home: Managing pain, stiffness, and swelling      If directed, for sudden (acute) back pain, put ice on the painful area. ? Put ice in a plastic bag. ? Place a towel between your skin and the bag. ? Leave the ice on for 20 minutes, 2-3 times per day.  If directed, apply heat to the affected area before you exercise. Use the heat source that your health care provider recommends, such as a moist heat pack or a heating pad. ? Place a towel between your skin and the heat source. ? Leave the heat on for 20-30 minutes. ? Remove the heat if your skin turns bright red. This is especially important if you are unable to feel pain, heat, or cold. You may have a greater risk of getting burned.  If directed, massage the affected area. Activity  Exercise as told by your health care provider. Gentle exercise is the best way to prevent or manage back pain.  Listen to your body when lifting. If lifting hurts, ask for help or bend your knees. This uses your leg muscles instead of your back muscles.  Squat down when picking up something from the floor. Do not bend over.  Only use bed rest for short periods as told by your health care provider. Bed rest should only be used for the most severe episodes of back pain. Standing, sitting, and lying down  Do not stand in one place for long periods of time.  Use good posture when sitting. Make sure your head rests over your shoulders and is not hanging forward. Use a pillow on your lower back if  necessary.  Try sleeping on your side, preferably the left side, with a pregnancy support pillow or 1-2 regular pillows between your legs. ? If you have back pain after a night's rest, your bed may be too soft. ? A firm mattress may provide more support for your back during pregnancy. General instructions  Do not wear high heels.  Eat a healthy diet. Try to gain weight within your health care provider's recommendations.  Use a maternity girdle, elastic sling, or back brace as told by your health care provider.  Take over-the-counter and prescription medicines only as told by your health care provider.  Work with a physical therapist or massage therapist to find ways to manage back pain. Acupuncture or massage therapy may be helpful.  Keep all follow-up visits as told by your health care provider. This is important. Contact a health care provider if:  Your back pain interferes with your daily activities.  You have increasing pain in other parts of your body. Get help right away if:  You develop numbness, tingling, weakness, or problems with the use of your arms or legs.  You develop severe back pain that is not controlled with  medicine.  You have a change in bowel or bladder control.  You develop shortness of breath, dizziness, or you faint.  You develop nausea, vomiting, or sweating.  You have back pain that is a rhythmic, cramping pain similar to labor pains. Labor pain is usually 1-2 minutes apart, lasts for about 1 minute, and involves a bearing down feeling or pressure in your pelvis.  You have back pain and your water breaks or you have vaginal bleeding.  You have back pain or numbness that travels down your leg.  Your back pain developed after you fell.  You develop pain on one side of your back.  You see blood in your urine.  You develop skin blisters in the area of your back pain. Summary  Back pain may be caused by several factors that are related to changes  during your pregnancy.  Follow instructions as told by your health care provider for managing pain, stiffness, and swelling.  Exercise as told by your health care provider. Gentle exercise is the best way to prevent or manage back pain.  Take over-the-counter and prescription medicines only as told by your health care provider.  Keep all follow-up visits as told by your health care provider. This is important. This information is not intended to replace advice given to you by your health care provider. Make sure you discuss any questions you have with your health care provider. Document Released: 08/22/2005 Document Revised: 09/02/2018 Document Reviewed: 10/30/2017 Elsevier Patient Education  2020 [employer]
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
## 2019-01-08 NOTE — ED Provider Notes (Signed)
[facility]    CSN: [id number] Arrival date & time: 01/07/19  1338     History   Chief Complaint Chief Complaint  Patient presents with  . Dizziness  . SEXUALLY TRANSMITTED DISEASE    HPI Casey Duncan is a 38 y.o. female.   Pt is a 38 year old female that presents with dizziness that started this am. Describes as feeling lightheaded. Symptoms are waxing and waning.  Does not feel like the room is spinning.  No blurred vision. No nausea.  Mild frontal headache.  Also has some associated nasal congestion and rhinorrhea. Hx of allergies. She has been eating and drinking okay. No vomiting or diarrhea.  She is approximated [redacted] weeks pregnant.  Had some bleeding in pregnancy but that has resolved.  No abdominal pain, back pain, dysuria, hematuria or urinary frequency.  No vaginal bleeding.   She would also like to be checked for STDs. No fever. Recently tested for COVID and was negative.  No history of anemia  ROS per HPI      Past Medical History:  Diagnosis Date  . Abnormal vaginal bleeding   . Medical history non-contributory   . No pertinent past medical history     There are no active problems to display for this patient.   Past Surgical History:  Procedure Laterality Date  . BREAST SURGERY    . vaginal rejuvenation      OB History    Gravida  4   Para  3   Term  3   Preterm      AB  0   Living  2     SAB      TAB      Ectopic      Multiple      Live Births               Home Medications    Prior to Admission medications   Medication Sig Start Date End Date Taking? Authorizing Provider  cetirizine (ZYRTEC) 10 MG tablet Take 1 tablet (10 mg total) by mouth daily. 01/07/19   Dahlia ByesBast, Amandajo Gonder A, NP  diphenhydrAMINE (BENADRYL) 25 mg capsule Take 25 mg by mouth every 6 (six) hours as needed for itching.    [provider]  fluticasone (FLONASE) 50 MCG/ACT nasal spray Place 1 spray into both nostrils daily. 01/07/19   Dahlia ByesBast,  Jerimie Mancuso A, NP  terconazole (TERAZOL 7) 0.4 % vaginal cream Place 1 applicator vaginally at bedtime for 7 days. 01/08/19 01/15/19  Nugent, Odie SeraNicole E, NP  trimethoprim-polymyxin b (POLYTRIM) ophthalmic solution Place 2 drops into the right eye every 4 (four) hours. 10/13/18   Eustace MooreNelson, Yvonne Sue, MD    Family History Family History  Problem Relation Age of Onset  . Healthy Mother   . Healthy Father     Social History Social History   Tobacco Use  . Smoking status: Current Every Day Smoker    Packs/day: 0.50  . Smokeless tobacco: Never Used  Substance Use Topics  . Alcohol use: No    Comment: Patient denies  . Drug use: Not Currently    Types: Marijuana    Comment: Patient reports she occasionally uses THC     Allergies   Sulfa antibiotics   Review of Systems Review of Systems   Physical Exam Triage Vital Signs ED Triage Vitals  Enc Vitals Group     BP 01/07/19 1458 (!) 115/59     Pulse Rate 01/07/19 1458 100  Resp 01/07/19 1458 18     Temp 01/07/19 1458 98.4 F (36.9 C)     Temp src --      SpO2 01/07/19 1458 100 %     Weight --      Height --      Head Circumference --      Peak Flow --      Pain Score 01/07/19 1455 0     Pain Loc --      Pain Edu? --      Excl. in GC? --    No data found.  Updated Vital Signs BP (!) 115/59   Pulse 100   Temp 98.4 F (36.9 C)   Resp 18   LMP 09/04/2018   SpO2 100%   Visual Acuity Right Eye Distance:   Left Eye Distance:   Bilateral Distance:    Right Eye Near:   Left Eye Near:    Bilateral Near:     Physical Exam Constitutional:      General: She is not in acute distress.    Appearance: Normal appearance. She is not ill-appearing, toxic-appearing or diaphoretic.  HENT:     Head: Normocephalic and atraumatic.     Right Ear: Tympanic membrane and ear canal normal.     Left Ear: Tympanic membrane and ear canal normal.     Nose: Mucosal edema, congestion and rhinorrhea present.     Right Turbinates: Swollen.      Left Turbinates: Swollen.  Eyes:     Conjunctiva/sclera: Conjunctivae normal.  Neck:     Musculoskeletal: Normal range of motion.  Cardiovascular:     Pulses: Normal pulses.  Pulmonary:     Effort: Pulmonary effort is normal.     Breath sounds: Normal breath sounds.  Musculoskeletal: Normal range of motion.  Skin:    General: Skin is warm and dry.  Neurological:     Mental Status: She is alert.  Psychiatric:        Mood and Affect: Mood normal.      UC Treatments / Results  Labs (all labs ordered are listed, but only abnormal results are displayed) Labs Reviewed  HIV ANTIBODY (ROUTINE TESTING W REFLEX)  RPR  CERVICOVAGINAL ANCILLARY ONLY    EKG   Radiology No results found.  Procedures Procedures (including critical care time)  Medications Ordered in UC Medications - No data to display  Initial Impression / Assessment and Plan / UC Course  I have reviewed the triage vital signs and the nursing notes.  Pertinent labs & imaging results that were available during my care of the patient were reviewed by me and considered in my medical decision making (see chart for details).    Dizziness-no concerning signs or symptoms on exam.-Focal neuro deficits.  Vital signs stable and she is nontoxic or ill-appearing. Symptoms most likely related to allergies.  Recommended Flonase and Zyrtec daily for symptoms. Rest, hydrate Patient requested STD testing.  Swab sent for testing.  HIV and syphilis testing also done. Recommended if symptoms continue or worsen she will need to follow-up with her primary care or go to the hospital.  Final Clinical Impressions(s) / UC Diagnoses   Final diagnoses:  Dizziness  Screen for STD (sexually transmitted disease)     Discharge Instructions     I believe that your symptoms are related to allergies.  Flonase and Zyrtec daily for symptoms Make sure you are staying hydrated and drinking plenty of fluids.  Rest Swab sent for  testing and we will call you with any positive results Follow up as needed for continued or worsening symptoms     ED Prescriptions    Medication Sig Dispense Auth. Provider   fluticasone (FLONASE) 50 MCG/ACT nasal spray Place 1 spray into both nostrils daily. 16 g Brixton Franko A, NP   cetirizine (ZYRTEC) 10 MG tablet Take 1 tablet (10 mg total) by mouth daily. 30 tablet Loura Halt A, NP     Controlled Substance Prescriptions [place] Controlled Substance Registry consulted? Not Applicable   Orvan July, NP 01/09/19 1157

## 2019-01-09 LAB — CERVICOVAGINAL ANCILLARY ONLY
Bacterial vaginitis: NEGATIVE
Candida vaginitis: NEGATIVE
Chlamydia: NEGATIVE
Neisseria Gonorrhea: NEGATIVE
Trichomonas: NEGATIVE

## 2019-01-10 LAB — CULTURE, OB URINE: Culture: >=100000 — AB

## 2019-02-03 LAB — OB RESULTS CONSOLE ANTIBODY SCREEN: Antibody Screen: NEGATIVE

## 2019-04-01 ENCOUNTER — Inpatient Hospital Stay (HOSPITAL_COMMUNITY)
Admission: AD | Admit: 2019-04-01 | Discharge: 2019-04-02 | Discharge status: Against Medical Advice | Payer: Medicaid Other | Source: Ambulatory Visit | Attending: Obstetrics & Gynecology | Admitting: Obstetrics & Gynecology

## 2019-04-01 ENCOUNTER — Encounter (HOSPITAL_COMMUNITY): Payer: Self-pay

## 2019-04-01 ENCOUNTER — Other Ambulatory Visit: Payer: Self-pay

## 2019-04-01 DIAGNOSIS — Z5329 Procedure and treatment not carried out because of patient's decision for other reasons: Secondary | ICD-10-CM | POA: Diagnosis not present

## 2019-04-01 DIAGNOSIS — O99333 Smoking (tobacco) complicating pregnancy, third trimester: Secondary | ICD-10-CM | POA: Diagnosis not present

## 2019-04-01 DIAGNOSIS — Z3A29 29 weeks gestation of pregnancy: Secondary | ICD-10-CM | POA: Diagnosis not present

## 2019-04-01 DIAGNOSIS — R1031 Right lower quadrant pain: Secondary | ICD-10-CM | POA: Diagnosis present

## 2019-04-01 DIAGNOSIS — F17210 Nicotine dependence, cigarettes, uncomplicated: Secondary | ICD-10-CM | POA: Diagnosis not present

## 2019-04-01 DIAGNOSIS — Z882 Allergy status to sulfonamides status: Secondary | ICD-10-CM | POA: Insufficient documentation

## 2019-04-01 DIAGNOSIS — O26893 Other specified pregnancy related conditions, third trimester: Secondary | ICD-10-CM | POA: Insufficient documentation

## 2019-04-01 HISTORY — DX: Anemia, unspecified: D64.9

## 2019-04-01 MED ORDER — ACETAMINOPHEN 500 MG PO TABS
1000.0000 mg | ORAL_TABLET | Freq: Four times a day (QID) | ORAL | Status: DC | PRN
  Administered 2019-04-02: 1000 mg via ORAL
  Filled 2019-04-01: qty 2

## 2019-04-01 NOTE — MAU Provider Note (Signed)
History     [id number]  Arrival date and time: 04/01/19 2228    No chief complaint on file.    HPI Casey Duncan is a 38 y.o. at 7364w6d by 11wk US, who presents for RLQ pain.   Has severe pain in RLQ Present for past 4-5 hours, has been constant and increasing in intensity Has never had anything similar prior Denies n/v, fever, diarrhea, burning or pain with urination, abnormal vaginal discharge Reports wet prep and STI testing yesterday that was all negative (confirmed in Care Everywhere)  No personal or family history of kidney stones Has had liposuction, vaginal reconstruction, and breast surgery No prior abdominal sugery All prior deliveries were vaginal Reports cervix checked at clinic yesterday, told she was closed  Has not taken anything for the pain   Vaginal bleeding: No LOF: No Fetal Movement: Yes Contractions: No     OB History    Gravida  4   Para  3   Term  3   Preterm      AB  0   Living  2     SAB      TAB      Ectopic      Multiple      Live Births  3        Obstetric Comments  Baby passed @ 16 months         Past Medical History:  Diagnosis Date  . Abnormal vaginal bleeding   . Anemia   . Medical history non-contributory   . No pertinent past medical history     Past Surgical History:  Procedure Laterality Date  . BREAST SURGERY    . vaginal rejuvenation      Family History  Problem Relation Age of Onset  . Healthy Mother   . Healthy Father     Social History   Socioeconomic History  . Marital status: Single    Spouse name: Not on file  . Number of children: Not on file  . Years of education: Not on file  . Highest education level: Not on file  Occupational History  . Not on file  Social Needs  . Financial resource strain: Not on file  . Food insecurity    Worry: Not on file    Inability: Not on file  . Transportation needs    Medical: Not on file    Non-medical: Not on file  Tobacco Use  .  Smoking status: Current Every Day Smoker    Packs/day: 2.00  . Smokeless tobacco: Never Used  Substance and Sexual Activity  . Alcohol use: No    Comment: Patient denies  . Drug use: Not Currently    Types: Marijuana    Comment: does not use anymore   . Sexual activity: Not Currently    Birth control/protection: None  Lifestyle  . Physical activity    Days per week: Not on file    Minutes per session: Not on file  . Stress: Not on file  Relationships  . Social Musicianconnections    Talks on phone: Not on file    Gets together: Not on file    Attends religious service: Not on file    Active member of club or organization: Not on file    Attends meetings of clubs or organizations: Not on file    Relationship status: Not on file  . Intimate partner violence    Fear of current or ex partner: Not on file  Emotionally abused: Not on file    Physically abused: Not on file    Forced sexual activity: Not on file  Other Topics Concern  . Not on file  Social History Narrative  . Not on file    Allergies  Allergen Reactions  . Sulfa Antibiotics Itching    Throat and tongue itching    No current facility-administered medications on file prior to encounter.    Current Outpatient Medications on File Prior to Encounter  Medication Sig Dispense Refill  . cetirizine (ZYRTEC) 10 MG tablet Take 1 tablet (10 mg total) by mouth daily. 30 tablet 0  . diphenhydrAMINE (BENADRYL) 25 mg capsule Take 25 mg by mouth every 6 (six) hours as needed for itching.    . fluticasone (FLONASE) 50 MCG/ACT nasal spray Place 1 spray into both nostrils daily. 16 g 2  . trimethoprim-polymyxin b (POLYTRIM) ophthalmic solution Place 2 drops into the right eye every 4 (four) hours. 10 mL 0     ROS Complete ROS completed and otherwise negative except as noted in HPI  Physical Exam   BP 125/66 (BP Location: Right Arm)   Pulse (!) 117   Temp 97.7 F (36.5 C)   Resp 20   Ht 5\' 9"  (1.753 m)   Wt 100.2 kg    LMP 09/04/2018   BMI 32.64 kg/m   Physical Exam  Vitals reviewed. Constitutional: She appears well-developed and well-nourished. No distress.  Cardiovascular: Normal rate, regular rhythm and normal heart sounds.  No murmur heard. Respiratory: Effort normal and breath sounds normal. No respiratory distress. She has no wheezes. She has no rales.  GI: Soft. She exhibits no distension. There is abdominal tenderness. There is no rebound and no guarding.  Significant tenderness to palpation in RLQ, rest of abdomen soft and nontender to palpation  Genitourinary:    Genitourinary Comments: SVE: cervix closed   Musculoskeletal:        General: No edema.  Neurological: She is alert. Coordination normal.  Skin: Skin is warm and dry. She is not diaphoretic.  Psychiatric: She has a normal mood and affect.   Labs Results for orders placed or performed during the hospital encounter of 04/01/19 (from the past 24 hour(s))  CBC     Status: Abnormal   Collection Time: 04/01/19 11:57 PM  Result Value Ref Range   WBC 17.8 (H) 4.0 - 10.5 K/uL   RBC 3.14 (L) 3.87 - 5.11 MIL/uL   Hemoglobin 9.7 (L) 12.0 - 15.0 g/dL   HCT 13/04/20 (L) 30.1 - 60.1 %   MCV 92.0 80.0 - 100.0 fL   MCH 30.9 26.0 - 34.0 pg   MCHC 33.6 30.0 - 36.0 g/dL   RDW 09.3 23.5 - 57.3 %   Platelets 378 150 - 400 K/uL   nRBC 0.0 0.0 - 0.2 %  Comprehensive metabolic panel     Status: Abnormal   Collection Time: 04/01/19 11:57 PM  Result Value Ref Range   Sodium 136 135 - 145 mmol/L   Potassium 3.3 (L) 3.5 - 5.1 mmol/L   Chloride 106 98 - 111 mmol/L   CO2 21 (L) 22 - 32 mmol/L   Glucose, Bld 88 70 - 99 mg/dL   BUN 6 6 - 20 mg/dL   Creatinine, Ser 13/04/20 0.44 - 1.00 mg/dL   Calcium 8.8 (L) 8.9 - 10.3 mg/dL   Total Protein 6.6 6.5 - 8.1 g/dL   Albumin 2.9 (L) 3.5 - 5.0 g/dL   AST 12 (L)  15 - 41 U/L   ALT 9 0 - 44 U/L   Alkaline Phosphatase 98 38 - 126 U/L   Total Bilirubin 0.3 0.3 - 1.2 mg/dL   GFR calc non Af Amer >60 >60 mL/min    GFR calc Af Amer >60 >60 mL/min   Anion gap 9 5 - 15     Bedside Ultrasound Bilateral kidneys unremarkable without evidence of hydronephrosis  FHT Baseline 145, moderate variability, +accels, no decels, no contractions Cat I Reactive NST  MAU Course  Procedures NST MRI Abd/Pel CBC, CMP, UA  MDM High  Assessment and Plan  #RLQ Pain Unclear etiology at present. No other symptoms to guide differential, however CBC shows elevated WBC at 17.8 concerning for infectious etiology with appendicitis high on differential. CMP with borderline hypokalemia, otherwise unremarkable. No urinary symptoms and BSUS shows normal kidneys to suggest renal etiology, UA w small hgb will add on microscopy but low suspicion. Less likely torsion given lack of n/v symptoms. Prior infectious testing from day prior all negative. SVE with cervix closed, no contractions on monitor, unlikely labor.   Ordered for MRI to evaluate for appendicitis, however notified at ~0200 that patient had left the MAU as she did not want to wait any longer. Earlier during her stay I had had a long conversation with patient about differential and specifically regarding concern for appendicitis. Explicitly discussed possibility of infection, sepsis, and death from untreated appendicitis. Also noted possibility of ovarian torsion though this is less likely. She expressed concern at that time about length of wait and needing to take an exam in the morning but ultimately agreed to stay for workup.    After I was notified that patient had left AMA called her cellphone as listed in Epic but no response. Outside Epic message sent to [facility] provider at [facility] for follow up in AM.    Clarnce Flock

## 2019-04-01 NOTE — MAU Note (Signed)
Haivng pain RLQ for 4hrs. Cramping and constant. Sometimes pain is sharp. Denies vag bleeding or LOF. Reports decreased FM tonight

## 2019-04-02 LAB — URINALYSIS, ROUTINE W REFLEX MICROSCOPIC
Bilirubin Urine: NEGATIVE
Glucose, UA: NEGATIVE mg/dL
Ketones, ur: NEGATIVE mg/dL
Leukocytes,Ua: NEGATIVE
Nitrite: NEGATIVE
Protein, ur: NEGATIVE mg/dL
Specific Gravity, Urine: 1.003 — ABNORMAL LOW (ref 1.005–1.030)
pH: 7 (ref 5.0–8.0)

## 2019-04-02 LAB — COMPREHENSIVE METABOLIC PANEL
ALT: 9 U/L (ref 0–44)
AST: 12 U/L — ABNORMAL LOW (ref 15–41)
Albumin: 2.9 g/dL — ABNORMAL LOW (ref 3.5–5.0)
Alkaline Phosphatase: 98 U/L (ref 38–126)
Anion gap: 9 (ref 5–15)
BUN: 6 mg/dL (ref 6–20)
CO2: 21 mmol/L — ABNORMAL LOW (ref 22–32)
Calcium: 8.8 mg/dL — ABNORMAL LOW (ref 8.9–10.3)
Chloride: 106 mmol/L (ref 98–111)
Creatinine, Ser: 0.67 mg/dL (ref 0.44–1.00)
GFR calc Af Amer: >60 mL/min (ref >60)
GFR calc non Af Amer: >60 mL/min (ref >60)
Glucose, Bld: 88 mg/dL (ref 70–99)
Potassium: 3.3 mmol/L — ABNORMAL LOW (ref 3.5–5.1)
Sodium: 136 mmol/L (ref 135–145)
Total Bilirubin: 0.3 mg/dL (ref 0.3–1.2)
Total Protein: 6.6 g/dL (ref 6.5–8.1)

## 2019-04-02 LAB — CBC
HCT: 28.9 % — ABNORMAL LOW (ref 36.0–46.0)
Hemoglobin: 9.7 g/dL — ABNORMAL LOW (ref 12.0–15.0)
MCH: 30.9 pg (ref 26.0–34.0)
MCHC: 33.6 g/dL (ref 30.0–36.0)
MCV: 92 fL (ref 80.0–100.0)
Platelets: 378 10*3/uL (ref 150–400)
RBC: 3.14 MIL/uL — ABNORMAL LOW (ref 3.87–5.11)
RDW: 13.8 % (ref 11.5–15.5)
WBC: 17.8 10*3/uL — ABNORMAL HIGH (ref 4.0–10.5)
nRBC: 0 % (ref 0.0–0.2)

## 2019-04-02 NOTE — MAU Note (Addendum)
Pt left AMA. Form signed. Dr. Dione Plover made aware. He attempted to call the patient with no answer.    Gilmer Mor RN

## 2019-04-15 ENCOUNTER — Encounter (HOSPITAL_COMMUNITY): Payer: Self-pay | Admitting: *Deleted

## 2019-04-15 ENCOUNTER — Inpatient Hospital Stay (HOSPITAL_COMMUNITY)
Admission: AD | Admit: 2019-04-15 | Discharge: 2019-04-15 | Disposition: A | Discharge status: Home / Self Care | Payer: Medicaid Other | Source: Ambulatory Visit | Attending: Family Medicine | Admitting: Family Medicine

## 2019-04-15 DIAGNOSIS — F17210 Nicotine dependence, cigarettes, uncomplicated: Secondary | ICD-10-CM | POA: Diagnosis not present

## 2019-04-15 DIAGNOSIS — O99333 Smoking (tobacco) complicating pregnancy, third trimester: Secondary | ICD-10-CM | POA: Diagnosis not present

## 2019-04-15 DIAGNOSIS — R109 Unspecified abdominal pain: Secondary | ICD-10-CM | POA: Diagnosis not present

## 2019-04-15 DIAGNOSIS — Z0371 Encounter for suspected problem with amniotic cavity and membrane ruled out: Secondary | ICD-10-CM

## 2019-04-15 DIAGNOSIS — R102 Pelvic and perineal pain: Secondary | ICD-10-CM

## 2019-04-15 DIAGNOSIS — Z3A31 31 weeks gestation of pregnancy: Secondary | ICD-10-CM | POA: Insufficient documentation

## 2019-04-15 DIAGNOSIS — O368130 Decreased fetal movements, third trimester, not applicable or unspecified: Secondary | ICD-10-CM | POA: Diagnosis not present

## 2019-04-15 DIAGNOSIS — O26899 Other specified pregnancy related conditions, unspecified trimester: Secondary | ICD-10-CM | POA: Diagnosis not present

## 2019-04-15 DIAGNOSIS — Z3689 Encounter for other specified antenatal screening: Secondary | ICD-10-CM

## 2019-04-15 LAB — POCT FERN TEST: POCT Fern Test: NEGATIVE
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
## 2019-04-15 NOTE — Discharge Instructions (Signed)

## 2019-05-03 ENCOUNTER — Other Ambulatory Visit: Payer: Self-pay

## 2019-05-03 ENCOUNTER — Inpatient Hospital Stay (HOSPITAL_COMMUNITY)
Admission: AD | Admit: 2019-05-03 | Discharge: 2019-05-03 | Disposition: A | Discharge status: Home / Self Care | Payer: Medicaid Other | Source: Ambulatory Visit | Attending: Obstetrics and Gynecology | Admitting: Obstetrics and Gynecology

## 2019-05-03 ENCOUNTER — Encounter (HOSPITAL_COMMUNITY): Payer: Self-pay | Admitting: *Deleted

## 2019-05-03 DIAGNOSIS — Z3A34 34 weeks gestation of pregnancy: Secondary | ICD-10-CM

## 2019-05-03 DIAGNOSIS — F17210 Nicotine dependence, cigarettes, uncomplicated: Secondary | ICD-10-CM | POA: Diagnosis not present

## 2019-05-03 DIAGNOSIS — O99333 Smoking (tobacco) complicating pregnancy, third trimester: Secondary | ICD-10-CM | POA: Insufficient documentation

## 2019-05-03 DIAGNOSIS — O479 False labor, unspecified: Secondary | ICD-10-CM

## 2019-05-03 DIAGNOSIS — O4703 False labor before 37 completed weeks of gestation, third trimester: Secondary | ICD-10-CM

## 2019-05-03 HISTORY — DX: Gestational diabetes mellitus in pregnancy, unspecified control: O24.419

## 2019-05-03 LAB — URINALYSIS, ROUTINE W REFLEX MICROSCOPIC
Bacteria, UA: NONE SEEN
Bilirubin Urine: NEGATIVE
Glucose, UA: NEGATIVE mg/dL
Ketones, ur: NEGATIVE mg/dL
Leukocytes,Ua: NEGATIVE
Nitrite: NEGATIVE
Protein, ur: NEGATIVE mg/dL
Specific Gravity, Urine: 1.004 — ABNORMAL LOW (ref 1.005–1.030)
pH: 7 (ref 5.0–8.0)

## 2019-05-03 NOTE — Discharge Instructions (Signed)
Fetal Movement Counts Patient Name: ________________________________________________ Patient Due Date: ____________________ What is a fetal movement count?  A fetal movement count is the number of times that you feel your baby move during a certain amount of time. This may also be called a fetal kick count. A fetal movement count is recommended for every pregnant woman. You may be asked to start counting fetal movements as early as week 28 of your pregnancy. Pay attention to when your baby is most active. You may notice your baby's sleep and wake cycles. You may also notice things that make your baby move more. You should do a fetal movement count:  When your baby is normally most active.  At the same time each day. A good time to count movements is while you are resting, after having something to eat and drink. How do I count fetal movements? 1. Find a quiet, comfortable area. Sit, or lie down on your side. 2. Write down the date, the start time and stop time, and the number of movements that you felt between those two times. Take this information with you to your health care visits. 3. For 2 hours, count kicks, flutters, swishes, rolls, and jabs. You should feel at least 10 movements during 2 hours. 4. You may stop counting after you have felt 10 movements. 5. If you do not feel 10 movements in 2 hours, have something to eat and drink. Then, keep resting and counting for 1 hour. If you feel at least 4 movements during that hour, you may stop counting. Contact a health care provider if:  You feel fewer than 4 movements in 2 hours.  Your baby is not moving like he or she usually does. Date: ____________ Start time: ____________ Stop time: ____________ Movements: ____________ Date: ____________ Start time: ____________ Stop time: ____________ Movements: ____________ Date: ____________ Start time: ____________ Stop time: ____________ Movements: ____________ Date: ____________ Start time:  ____________ Stop time: ____________ Movements: ____________ Date: ____________ Start time: ____________ Stop time: ____________ Movements: ____________ Date: ____________ Start time: ____________ Stop time: ____________ Movements: ____________ Date: ____________ Start time: ____________ Stop time: ____________ Movements: ____________ Date: ____________ Start time: ____________ Stop time: ____________ Movements: ____________ Date: ____________ Start time: ____________ Stop time: ____________ Movements: ____________ This information is not intended to replace advice given to you by your health care provider. Make sure you discuss any questions you have with your health care provider. Document Released: 06/13/2006 Document Revised: 06/03/2018 Document Reviewed: 06/23/2015 Elsevier Patient Education  2020 Else[employer] Signs and Symptoms of Labor Labor is your body's natural process of moving your baby, placenta, and umbilical cord out of your uterus. The process of labor usually starts when your baby is full-term, between 37 and 40 weeks of pregnancy. How will I know when I am close to going into labor? As your body prepares for labor and the birth of your baby, you may notice the following symptoms in the weeks and days before true labor starts:  Having a strong desire to get your home ready to receive your new baby. This is called nesting. Nesting may be a sign that labor is approaching, and it may occur several weeks before birth. Nesting may involve cleaning and organizing your home.  Passing a small amount of thick, bloody mucus out of your vagina (normal bloody show or losing your mucus plug). This may happen more than a week before labor begins, or it might occur right before labor begins as the opening of the cervix starts   to widen (dilate). For some women, the entire mucus plug passes at once. For others, smaller portions of the mucus plug may gradually pass over several days.  Your baby  moving (dropping) lower in your pelvis to get into position for birth (lightening). When this happens, you may feel more pressure on your bladder and pelvic bone and less pressure on your ribs. This may make it easier to breathe. It may also cause you to need to urinate more often and have problems with bowel movements.  Having "practice contractions" (Braxton Hicks contractions) that occur at irregular (unevenly spaced) intervals that are more than 10 minutes apart. This is also called false labor. False labor contractions are common after exercise or sexual activity, and they will stop if you change position, rest, or drink fluids. These contractions are usually mild and do not get stronger over time. They may feel like: ? A backache or back pain. ? Mild cramps, similar to menstrual cramps. ? Tightening or pressure in your abdomen. Other early symptoms that labor may be starting soon include:  Nausea or loss of appetite.  Diarrhea.  Having a sudden burst of energy, or feeling very tired.  Mood changes.  Having trouble sleeping. How will I know when labor has begun? Signs that true labor has begun may include:  Having contractions that come at regular (evenly spaced) intervals and increase in intensity. This may feel like more intense tightening or pressure in your abdomen that moves to your back. ? Contractions may also feel like rhythmic pain in your upper thighs or back that comes and goes at regular intervals. ? For first-time mothers, this change in intensity of contractions often occurs at a more gradual pace. ? Women who have given birth before may notice a more rapid progression of contraction changes.  Having a feeling of pressure in the vaginal area.  Your water breaking (rupture of membranes). This is when the sac of fluid that surrounds your baby breaks. When this happens, you will notice fluid leaking from your vagina. This may be clear or blood-tinged. Labor usually starts  within 24 hours of your water breaking, but it may take longer to begin. ? Some women notice this as a gush of fluid. ? Others notice that their underwear repeatedly becomes damp. Follow these instructions at home:   When labor starts, or if your water breaks, call your health care provider or nurse care line. Based on your situation, they will determine when you should go in for an exam.  When you are in early labor, you may be able to rest and manage symptoms at home. Some strategies to try at home include: ? Breathing and relaxation techniques. ? Taking a warm bath or shower. ? Listening to music. ? Using a heating pad on the lower back for pain. If you are directed to use heat:  Place a towel between your skin and the heat source.  Leave the heat on for 20-30 minutes.  Remove the heat if your skin turns bright red. This is especially important if you are unable to feel pain, heat, or cold. You may have a greater risk of getting burned. Get help right away if:  You have painful, regular contractions that are 5 minutes apart or less.  Labor starts before you are [redacted] weeks along in your pregnancy.  You have a fever.  You have a headache that does not go away.  You have bright red blood coming from your vagina.  You   do not feel your baby moving.  You have a sudden onset of: ? Severe headache with vision problems. ? Nausea, vomiting, or diarrhea. ? Chest pain or shortness of breath. These symptoms may be an emergency. If your health care provider recommends that you go to the hospital or birth center where you plan to deliver, do not drive yourself. Have someone else drive you, or call emergency services (911 in the [place] Summary  Labor is your body's natural process of moving your baby, placenta, and umbilical cord out of your uterus.  The process of labor usually starts when your baby is full-term, between 37 and 40 weeks of pregnancy.  When labor starts, or if your water  breaks, call your health care provider or nurse care line. Based on your situation, they will determine when you should go in for an exam. This information is not intended to replace advice given to you by your health care provider. Make sure you discuss any questions you have with your health care provider. Document Released: 10/19/2016 Document Revised: 02/11/2017 Document Reviewed: 10/19/2016 Elsevier Patient Education  2020 Elsevier Inc.  

## 2019-05-03 NOTE — MAU Note (Signed)
Pt reports she has had ctx on and off all day. Not as often now but c/o increased pelvic pressure and pain. Also reports fetal movement a little less than usual.  Denies any vag bleeding or leaking at this time. Thinks some of her mucus plug came out yesterday.

## 2019-05-03 NOTE — MAU Provider Note (Signed)
Chief Complaint:  Contractions and Pelvic Pain   First Provider Initiated Contact with Patient 05/03/19 0048     HPI: Casey Duncan is a 38 y.o. [id number] at [redacted]w[redacted]d who presents to maternity admissions reporting contractions. Reports 1-2 painful contractions per hour earlier today. Since arriving to MAU they have stopped. Denies vomiting, diarrhea, dysuria, vaginal bleeding, LOF, or recent intercourse. Did have nausea earlier this evening she relates to heartburn. Good fetal movement.   Location: abdomen Quality: contractions Severity: 8/10 in pain scale Duration: 1 day Timing: 1-2 times per hour earlier Modifying factors: none Associated signs and symptoms: none  Past Medical History:  Diagnosis Date  . Abnormal vaginal bleeding   . Anemia   . Gestational diabetes    OB History  Gravida Para Term Preterm AB Living  4 3 3    0 2  SAB TAB Ectopic Multiple Live Births          3    # Outcome Date GA Lbr Len/2nd Weight Sex Delivery Anes PTL Lv  4 Current           3 Term 10/05/08    M Vag-Spont EPI  LIV  2 Term 01/11/04    F Vag-Spont EPI N DEC  1 Term 06/10/00    M Vag-Spont EPI N LIV    Obstetric Comments  Baby passed @ 16 months in 2006   Past Surgical History:  Procedure Laterality Date  . BREAST SURGERY    . lipo 360    . vaginal rejuvenation     Family History  Problem Relation Age of Onset  . Healthy Mother   . Healthy Father    Social History   Tobacco Use  . Smoking status: Current Every Day Smoker    Packs/day: 2.00  . Smokeless tobacco: Never Used  Substance Use Topics  . Alcohol use: No    Comment: Patient denies  . Drug use: Not Currently    Types: Marijuana    Comment: does not use anymore    Allergies  Allergen Reactions  . Sulfa Antibiotics Itching    Throat and tongue itching   No medications prior to admission.    I have reviewed patient's Past Medical Hx, Surgical Hx, Family Hx, Social Hx, medications and allergies.   ROS:  Review  of Systems  Constitutional: Negative.   Gastrointestinal: Positive for abdominal pain and nausea. Negative for constipation, diarrhea and vomiting.  Genitourinary: Negative.     Physical Exam   Patient Vitals for the past 24 hrs:  BP Temp Pulse Resp Height Weight  05/03/19 0026 132/83 98.2 F (36.8 C) (!) 105 18 5\' 9"  (1.753 m) 104.3 kg    Constitutional: Well-developed, well-nourished female in no acute distress.  Cardiovascular: normal rate & rhythm, no murmur Respiratory: normal effort, lung sounds clear throughout GI: Abd soft, non-tender, gravid appropriate for gestational age. Pos BS x 4 MS: Extremities nontender, no edema, normal ROM Neurologic: Alert and oriented x 4.  GU:  Dilation: Closed Effacement (%): 50 Cervical Position: Posterior Exam by:: E.Trine Fread,NP  NST:  Baseline: 135 bpm, Variability: Good {> 6 bpm), Accelerations: Reactive and Decelerations: Absent   Labs: Results for orders placed or performed during the hospital encounter of 05/03/19 (from the past 24 hour(s))  Urinalysis, Routine w reflex microscopic     Status: Abnormal   Collection Time: 05/03/19 12:36 AM  Result Value Ref Range   Color, Urine STRAW (A) YELLOW   APPearance CLEAR CLEAR  Specific Gravity, Urine 1.004 (L) 1.005 - 1.030   pH 7.0 5.0 - 8.0   Glucose, UA NEGATIVE NEGATIVE mg/dL   Hgb urine dipstick MODERATE (A) NEGATIVE   Bilirubin Urine NEGATIVE NEGATIVE   Ketones, ur NEGATIVE NEGATIVE mg/dL   Protein, ur NEGATIVE NEGATIVE mg/dL   Nitrite NEGATIVE NEGATIVE   Leukocytes,Ua NEGATIVE NEGATIVE   RBC / HPF 0-5 0 - 5 RBC/hpf   WBC, UA 0-5 0 - 5 WBC/hpf   Bacteria, UA NONE SEEN NONE SEEN   Squamous Epithelial / LPF 0-5 0 - 5    Imaging:  No results found.  MAU Course: Orders Placed This Encounter  Procedures  . Urinalysis, Routine w reflex microscopic  . Discharge patient   No orders of the defined types were placed in this encounter.   MDM: Category 1 tracing. TOCO  flat Cervix closed  Assessment: 1. Braxton Hick's contraction   2. [redacted] weeks gestation of pregnancy     Plan: Discharge home in stable condition.  Preterm Labor precautions and fetal kick counts  Follow-up [facility] Follow up.   Specialty: Obstetrics and Gynecology Why: keep scheduled appointment or call office as needed Contact information: [address] [place] [address] [phone]       [facility] Follow up.   Specialty: Obstetrics and Gynecology Why: return for worsening symptoms Contact information: [address] [id number] [place] [phone]          Allergies as of 05/03/2019      Reactions   Sulfa Antibiotics Itching   Throat and tongue itching      Medication List    STOP taking these medications   cetirizine 10 MG tablet Commonly known as: ZYRTEC   diphenhydrAMINE 25 mg capsule Commonly known as: BENADRYL   fluticasone 50 MCG/ACT nasal spray Commonly known as: Waymon Budge, Junie Panning, NP 05/03/2019 1:17 AM

## 2019-05-11 ENCOUNTER — Other Ambulatory Visit: Payer: Self-pay

## 2019-05-11 ENCOUNTER — Encounter (HOSPITAL_COMMUNITY): Payer: Self-pay | Admitting: Obstetrics & Gynecology

## 2019-05-11 ENCOUNTER — Inpatient Hospital Stay (HOSPITAL_COMMUNITY)
Admission: AD | Admit: 2019-05-11 | Discharge: 2019-05-11 | Disposition: A | Discharge status: Home / Self Care | Payer: Medicaid Other | Attending: Obstetrics & Gynecology | Admitting: Obstetrics & Gynecology

## 2019-05-11 DIAGNOSIS — Z3A36 36 weeks gestation of pregnancy: Secondary | ICD-10-CM | POA: Insufficient documentation

## 2019-05-11 DIAGNOSIS — Z882 Allergy status to sulfonamides status: Secondary | ICD-10-CM | POA: Diagnosis not present

## 2019-05-11 DIAGNOSIS — O368130 Decreased fetal movements, third trimester, not applicable or unspecified: Secondary | ICD-10-CM | POA: Diagnosis not present

## 2019-05-11 DIAGNOSIS — O99333 Smoking (tobacco) complicating pregnancy, third trimester: Secondary | ICD-10-CM | POA: Insufficient documentation

## 2019-05-11 DIAGNOSIS — Z8632 Personal history of gestational diabetes: Secondary | ICD-10-CM | POA: Diagnosis not present

## 2019-05-11 DIAGNOSIS — F17210 Nicotine dependence, cigarettes, uncomplicated: Secondary | ICD-10-CM | POA: Diagnosis not present

## 2019-05-11 DIAGNOSIS — O99891 Other specified diseases and conditions complicating pregnancy: Secondary | ICD-10-CM | POA: Insufficient documentation

## 2019-05-11 DIAGNOSIS — N859 Noninflammatory disorder of uterus, unspecified: Secondary | ICD-10-CM | POA: Diagnosis present

## 2019-05-11 DIAGNOSIS — N858 Other specified noninflammatory disorders of uterus: Secondary | ICD-10-CM

## 2019-05-11 DIAGNOSIS — O09523 Supervision of elderly multigravida, third trimester: Secondary | ICD-10-CM | POA: Insufficient documentation

## 2019-05-11 NOTE — MAU Note (Signed)
Patient reports contractions approximately 4-6 mins apart before falling asleep, woke back up and they felt stronger so she came in.  No LOF/VB.  Felt normal FM prior to going to sleep, hasn't felt her move since waking up.  (FHR assessed=145)

## 2019-05-11 NOTE — MAU Provider Note (Signed)
Chief Complaint:  Contractions   First Provider Initiated Contact with Patient 05/11/19 0246     HPI  HPI: Casey Duncan is a 38 y.o. [id number] at 45w0dwho presents to maternity admissions reporting contractions and decreased fetal movement.  Very concerned about baby being OP.  Had a loss of her baby at 24mos old (2005) of unknown cause per record, she believes it to be related to being delivered OP.  States had frequent contractions at 4pm then they spaced out.  They started again tonight so she came right in. . She denies LOF, vaginal bleeding, vaginal itching/burning, urinary symptoms, h/a, dizziness, n/v, diarrhea, constipation or fever/chills.    RN Note: Patient reports contractions approximately 4-6 mins apart before falling asleep, woke back up and they felt stronger so she came in.  No LOF/VB.  Felt normal FM prior to going to sleep, hasn't felt her move since waking up.  (FHR assessed=145)  Past Medical History: Past Medical History:  Diagnosis Date  . Abnormal vaginal bleeding   . Anemia   . Gestational diabetes     Past obstetric history: OB History  Gravida Para Term Preterm AB Living  4 3 3    0 2  SAB TAB Ectopic Multiple Live Births          3    # Outcome Date GA Lbr Len/2nd Weight Sex Delivery Anes PTL Lv  4 Current           3 Term 10/05/08    M Vag-Spont EPI  LIV  2 Term 01/11/04    F Vag-Spont EPI N DEC  1 Term 06/10/00    M Vag-Spont EPI N LIV    Obstetric Comments  Baby passed @ 16 months in 2006    Past Surgical History: Past Surgical History:  Procedure Laterality Date  . BREAST SURGERY    . lipo 360    . vaginal rejuvenation      Family History: Family History  Problem Relation Age of Onset  . Healthy Mother   . Healthy Father     Social History: Social History   Tobacco Use  . Smoking status: Current Every Day Smoker    Packs/day: 2.00  . Smokeless tobacco: Never Used  Substance Use Topics  . Alcohol use: No    Comment: Patient  denies  . Drug use: Not Currently    Types: Marijuana    Comment: does not use anymore     Allergies:  Allergies  Allergen Reactions  . Sulfa Antibiotics Itching    Throat and tongue itching    Meds:  No medications prior to admission.    I have reviewed patient's Past Medical Hx, Surgical Hx, Family Hx, Social Hx, medications and allergies.   ROS:  Review of Systems  Constitutional: Negative for chills and fever.  Respiratory: Negative for shortness of breath.   Gastrointestinal: Positive for abdominal pain. Negative for constipation, diarrhea, nausea and vomiting.  Genitourinary: Negative for vaginal bleeding and vaginal discharge.  Musculoskeletal: Negative for back pain.   Other systems negative  Physical Exam   Patient Vitals for the past 24 hrs:  BP Temp Pulse Resp SpO2 Weight  05/11/19 0151 127/81 98 F (36.7 C) (!) 124 19 100 % 102.2 kg   Constitutional: Well-developed, well-nourished female in no acute distress.  Cardiovascular: normal rate and rhythm Respiratory: normal effort, clear to auscultation bilaterally GI: Abd soft, non-tender, gravid appropriate for gestational age.   No rebound or guarding. MS: Extremities  nontender, no edema, normal ROM Neurologic: Alert and oriented x 4.  GU: Neg CVAT.  PELVIC EXAM:  Dilation: Closed Cervical Position: Posterior Exam by:: Irish Elders, RN  FHT:  Baseline 140 , moderate variability, accelerations present, no decelerations Contractions: Uterine irritability with occasional contractions   Labs: No results found for this or any previous visit (from the past 24 hour(s)).    Imaging:  No results found.  MAU Course/MDM: NST reviewed and is reactive.  Fetus audibly quite active and pt states she feels her move now  Treatments in MAU included EFM.    Assessment: Single intrauterine pregnancy at [redacted]w[redacted]d Uterine irritability Decreased fetal movement earlier, now moving  Plan: Discharge home Labor  precautions and fetal kick counts Offered to monitor her for a while longer and recheck, pt declined Follow up in Office for prenatal visits   Encouraged to return here or to other Urgent Care/ED if she develops worsening of symptoms, increase in pain, fever, or other concerning symptoms.   Pt stable at time of discharge.  Wynelle Bourgeois CNM, MSN Certified Nurse-Midwife 05/11/2019 2:46 AM

## 2019-05-11 NOTE — Discharge Instructions (Signed)
Vaginal Delivery  Vaginal delivery means that you give birth by pushing your baby out of your birth canal (vagina). A team of health care providers will help you before, during, and after vaginal delivery. Birth experiences are unique for every woman and every pregnancy, and birth experiences vary depending on where you choose to give birth. What happens when I arrive at the birth center or hospital? Once you are in labor and have been admitted into the hospital or birth center, your health care provider may:  Review your pregnancy history and any concerns that you have.  Insert an IV into one of your veins. This may be used to give you fluids and medicines.  Check your blood pressure, pulse, temperature, and heart rate (vital signs).  Check whether your bag of water (amniotic sac) has broken (ruptured).  Talk with you about your birth plan and discuss pain control options. Monitoring Your health care provider may monitor your contractions (uterine monitoring) and your baby's heart rate (fetal monitoring). You may need to be monitored:  Often, but not continuously (intermittently).  All the time or for long periods at a time (continuously). Continuous monitoring may be needed if: ? You are taking certain medicines, such as medicine to relieve pain or make your contractions stronger. ? You have pregnancy or labor complications. Monitoring may be done by:  Placing a special stethoscope or a handheld monitoring device on your abdomen to check your baby's heartbeat and to check for contractions.  Placing monitors on your abdomen (external monitors) to record your baby's heartbeat and the frequency and length of contractions.  Placing monitors inside your uterus through your vagina (internal monitors) to record your baby's heartbeat and the frequency, length, and strength of your contractions. Depending on the type of monitor, it may remain in your uterus or on your baby's head until  birth.  Telemetry. This is a type of continuous monitoring that can be done with external or internal monitors. Instead of having to stay in bed, you are able to move around during telemetry. Physical exam Your health care provider may perform frequent physical exams. This may include:  Checking how and where your baby is positioned in your uterus.  Checking your cervix to determine: ? Whether it is thinning out (effacing). ? Whether it is opening up (dilating). What happens during labor and delivery?  Normal labor and delivery is divided into the following three stages: Stage 1  This is the longest stage of labor.  This stage can last for hours or days.  Throughout this stage, you will feel contractions. Contractions generally feel mild, infrequent, and irregular at first. They get stronger, more frequent (about every 2-3 minutes), and more regular as you move through this stage.  This stage ends when your cervix is completely dilated to 4 inches (10 cm) and completely effaced. Stage 2  This stage starts once your cervix is completely effaced and dilated and lasts until the delivery of your baby.  This stage may last from 20 minutes to 2 hours.  This is the stage where you will feel an urge to push your baby out of your vagina.  You may feel stretching and burning pain, especially when the widest part of your baby's head passes through the vaginal opening (crowning).  Once your baby is delivered, the umbilical cord will be clamped and cut. This usually occurs after waiting a period of 1-2 minutes after delivery.  Your baby will be placed on your bare chest (  skin-to-skin contact) in an upright position and covered with a warm blanket. Watch your baby for feeding cues, like rooting or sucking, and help the baby to your breast for his or her first feeding. Stage 3  This stage starts immediately after the birth of your baby and ends after you deliver the placenta.  This stage may  take anywhere from 5 to 30 minutes.  After your baby has been delivered, you will feel contractions as your body expels the placenta and your uterus contracts to control bleeding. What can I expect after labor and delivery?  After labor is over, you and your baby will be monitored closely until you are ready to go home to ensure that you are both healthy. Your health care team will teach you how to care for yourself and your baby.  You and your baby will stay in the same room (rooming in) during your hospital stay. This will encourage early bonding and successful breastfeeding.  You may continue to receive fluids and medicines through an IV.  Your uterus will be checked and massaged regularly (fundal massage).  You will have some soreness and pain in your abdomen, vagina, and the area of skin between your vaginal opening and your anus (perineum).  If an incision was made near your vagina (episiotomy) or if you had some vaginal tearing during delivery, cold compresses may be placed on your episiotomy or your tear. This helps to reduce pain and swelling.  You may be given a squirt bottle to use instead of wiping when you go to the bathroom. To use the squirt bottle, follow these steps: ? Before you urinate, fill the squirt bottle with warm water. Do not use hot water. ? After you urinate, while you are sitting on the toilet, use the squirt bottle to rinse the area around your urethra and vaginal opening. This rinses away any urine and blood. ? Fill the squirt bottle with clean water every time you use the bathroom.  It is normal to have vaginal bleeding after delivery. Wear a sanitary pad for vaginal bleeding and discharge. Summary  Vaginal delivery means that you will give birth by pushing your baby out of your birth canal (vagina).  Your health care provider may monitor your contractions (uterine monitoring) and your baby's heart rate (fetal monitoring).  Your health care provider may  perform a physical exam.  Normal labor and delivery is divided into three stages.  After labor is over, you and your baby will be monitored closely until you are ready to go home. This information is not intended to replace advice given to you by your health care provider. Make sure you discuss any questions you have with your health care provider. Document Released: 02/21/2008 Document Revised: 06/18/2017 Document Reviewed: 06/18/2017 Elsevier Patient Education  2020 [employer] Third Trimester of Pregnancy  The third trimester is from week 28 through week 40 (months 7 through 9). This trimester is when your unborn baby (fetus) is growing very fast. At the end of the ninth month, the unborn baby is about 20 inches in length. It weighs about 6-10 pounds. Follow these instructions at home: Medicines  Take over-the-counter and prescription medicines only as told by your doctor. Some medicines are safe and some medicines are not safe during pregnancy.  Take a prenatal vitamin that contains at least 600 micrograms (mcg) of folic acid.  If you have trouble pooping (constipation), take medicine that will make your stool soft (stool softener) if your doctor  approves. Eating and drinking   Eat regular, healthy meals.  Avoid raw meat and uncooked cheese.  If you get low calcium from the food you eat, talk to your doctor about taking a daily calcium supplement.  Eat four or five small meals rather than three large meals a day.  Avoid foods that are high in fat and sugars, such as fried and sweet foods.  To prevent constipation: ? Eat foods that are high in fiber, like fresh fruits and vegetables, whole grains, and beans. ? Drink enough fluids to keep your pee (urine) clear or pale yellow. Activity  Exercise only as told by your doctor. Stop exercising if you start to have cramps.  Avoid heavy lifting, wear low heels, and sit up straight.  Do not exercise if it is too hot, too humid,  or if you are in a place of great height (high altitude).  You may continue to have sex unless your doctor tells you not to. Relieving pain and discomfort  Wear a good support bra if your breasts are tender.  Take frequent breaks and rest with your legs raised if you have leg cramps or low back pain.  Take warm water baths (sitz baths) to soothe pain or discomfort caused by hemorrhoids. Use hemorrhoid cream if your doctor approves.  If you develop puffy, bulging veins (varicose veins) in your legs: ? Wear support hose or compression stockings as told by your doctor. ? Raise (elevate) your feet for 15 minutes, 3-4 times a day. ? Limit salt in your food. Safety  Wear your seat belt when driving.  Make a list of emergency phone numbers, including numbers for family, friends, the hospital, and police and fire departments. Preparing for your baby's arrival To prepare for the arrival of your baby:  Take prenatal classes.  Practice driving to the hospital.  Visit the hospital and tour the maternity area.  Talk to your work about taking leave once the baby comes.  Pack your hospital bag.  Prepare the baby's room.  Go to your doctor visits.  Buy a rear-facing car seat. Learn how to install it in your car. General instructions  Do not use hot tubs, steam rooms, or saunas.  Do not use any products that contain nicotine or tobacco, such as cigarettes and e-cigarettes. If you need help quitting, ask your doctor.  Do not drink alcohol.  Do not douche or use tampons or scented sanitary pads.  Do not cross your legs for long periods of time.  Do not travel for long distances unless you must. Only do so if your doctor says it is okay.  Visit your dentist if you have not gone during your pregnancy. Use a soft toothbrush to brush your teeth. Be gentle when you floss.  Avoid cat litter boxes and soil used by cats. These carry germs that can cause birth defects in the baby and can  cause a loss of your baby (miscarriage) or stillbirth.  Keep all your prenatal visits as told by your doctor. This is important. Contact a doctor if:  You are not sure if you are in labor or if your water has broken.  You are dizzy.  You have mild cramps or pressure in your lower belly.  You have a nagging pain in your belly area.  You continue to feel sick to your stomach, you throw up, or you have watery poop.  You have bad smelling fluid coming from your vagina.  You have pain when  you pee. Get help right away if:  You have a fever.  You are leaking fluid from your vagina.  You are spotting or bleeding from your vagina.  You have severe belly cramps or pain.  You lose or gain weight quickly.  You have trouble catching your breath and have chest pain.  You notice sudden or extreme puffiness (swelling) of your face, hands, ankles, feet, or legs.  You have not felt the baby move in over an hour.  You have severe headaches that do not go away with medicine.  You have trouble seeing.  You are leaking, or you are having a gush of fluid, from your vagina before you are 37 weeks.  You have regular belly spasms (contractions) before you are 37 weeks. Summary  The third trimester is from week 28 through week 40 (months 7 through 9). This time is when your unborn baby is growing very fast.  Follow your doctor's advice about medicine, food, and activity.  Get ready for the arrival of your baby by taking prenatal classes, getting all the baby items ready, preparing the baby's room, and visiting your doctor to be checked.  Get help right away if you are bleeding from your vagina, or you have chest pain and trouble catching your breath, or if you have not felt your baby move in over an hour. This information is not intended to replace advice given to you by your health care provider. Make sure you discuss any questions you have with your health care provider. Document  Released: 08/08/2009 Document Revised: 09/04/2018 Document Reviewed: 06/19/2016 Elsevier Patient Education  2020 [employer].

## 2019-05-14 ENCOUNTER — Inpatient Hospital Stay (HOSPITAL_COMMUNITY)
Admission: AD | Admit: 2019-05-14 | Discharge: 2019-05-14 | Disposition: A | Discharge status: Home / Self Care | Payer: Medicaid Other | Attending: Obstetrics & Gynecology | Admitting: Obstetrics & Gynecology

## 2019-05-14 ENCOUNTER — Encounter (HOSPITAL_COMMUNITY): Payer: Self-pay | Admitting: Obstetrics & Gynecology

## 2019-05-14 ENCOUNTER — Other Ambulatory Visit: Payer: Self-pay

## 2019-05-14 DIAGNOSIS — Z8632 Personal history of gestational diabetes: Secondary | ICD-10-CM | POA: Diagnosis not present

## 2019-05-14 DIAGNOSIS — Z882 Allergy status to sulfonamides status: Secondary | ICD-10-CM | POA: Insufficient documentation

## 2019-05-14 DIAGNOSIS — O471 False labor at or after 37 completed weeks of gestation: Secondary | ICD-10-CM | POA: Diagnosis not present

## 2019-05-14 DIAGNOSIS — F17210 Nicotine dependence, cigarettes, uncomplicated: Secondary | ICD-10-CM | POA: Insufficient documentation

## 2019-05-14 DIAGNOSIS — O99891 Other specified diseases and conditions complicating pregnancy: Secondary | ICD-10-CM | POA: Insufficient documentation

## 2019-05-14 DIAGNOSIS — R102 Pelvic and perineal pain: Secondary | ICD-10-CM | POA: Diagnosis not present

## 2019-05-14 DIAGNOSIS — O368130 Decreased fetal movements, third trimester, not applicable or unspecified: Secondary | ICD-10-CM

## 2019-05-14 DIAGNOSIS — O09523 Supervision of elderly multigravida, third trimester: Secondary | ICD-10-CM | POA: Insufficient documentation

## 2019-05-14 DIAGNOSIS — O99333 Smoking (tobacco) complicating pregnancy, third trimester: Secondary | ICD-10-CM | POA: Diagnosis not present

## 2019-05-14 DIAGNOSIS — O4703 False labor before 37 completed weeks of gestation, third trimester: Secondary | ICD-10-CM | POA: Diagnosis not present

## 2019-05-14 DIAGNOSIS — Z3A36 36 weeks gestation of pregnancy: Secondary | ICD-10-CM | POA: Diagnosis not present

## 2019-05-14 LAB — OB RESULTS CONSOLE GBS: GBS: NEGATIVE

## 2019-05-14 NOTE — MAU Provider Note (Signed)
Chief Complaint:  Decreased Fetal Movement and Pelvic Pain   First Provider Initiated Contact with Patient 05/14/19 0215     HPI: Casey Duncan is a 38 y.o. [id number] at [redacted]w[redacted]d who presents to maternity admissions reporting decreased fetal movement and contractions.  Wants to be induced, feeling uncomfortable and stressed with late pregnancy period.  Has appointment with Windy Kalata tomorrow.  . She denies LOF, vaginal bleeding, vaginal itching/burning, urinary symptoms, h/a, dizziness, n/v, diarrhea, constipation or fever/chills.  Pelvic Pain The patient's primary symptoms include pelvic pain. The patient's pertinent negatives include no genital itching, genital lesions, genital odor or vaginal bleeding. This is a recurrent problem. The current episode started today. The problem occurs intermittently. The problem has been unchanged. She is pregnant. Associated symptoms include abdominal pain. Pertinent negatives include no chills, constipation, diarrhea, fever, nausea or vomiting. Associated symptoms comments: Decreased fetal movement . Nothing aggravates the symptoms. She has tried nothing for the symptoms.   RN Note: No FM for couple hours. Some pelvic pressure and occ ctxs. Denies LOF or vag bleeding  Past Medical History: Past Medical History:  Diagnosis Date  . Abnormal vaginal bleeding   . Anemia   . Gestational diabetes     Past obstetric history: OB History  Gravida Para Term Preterm AB Living  4 3 3    0 2  SAB TAB Ectopic Multiple Live Births          3    # Outcome Date GA Lbr Len/2nd Weight Sex Delivery Anes PTL Lv  4 Current           3 Term 10/05/08    M Vag-Spont EPI  LIV  2 Term 01/11/04    F Vag-Spont EPI N DEC  1 Term 06/10/00    M Vag-Spont EPI N LIV    Obstetric Comments  Baby passed @ 16 months in 2006    Past Surgical History: Past Surgical History:  Procedure Laterality Date  . BREAST SURGERY    . lipo 360    . vaginal rejuvenation      Family  History: Family History  Problem Relation Age of Onset  . Healthy Mother   . Healthy Father     Social History: Social History   Tobacco Use  . Smoking status: Current Every Day Smoker    Packs/day: 2.00  . Smokeless tobacco: Never Used  Substance Use Topics  . Alcohol use: No    Comment: Patient denies  . Drug use: Not Currently    Types: Marijuana    Comment: does not use anymore     Allergies:  Allergies  Allergen Reactions  . Sulfa Antibiotics Itching    Throat and tongue itching    Meds:  No medications prior to admission.    I have reviewed patient's Past Medical Hx, Surgical Hx, Family Hx, Social Hx, medications and allergies.   ROS:  Review of Systems  Constitutional: Negative for chills and fever.  Gastrointestinal: Positive for abdominal pain. Negative for constipation, diarrhea, nausea and vomiting.  Genitourinary: Positive for pelvic pain.   Other systems negative  Physical Exam   Patient Vitals for the past 24 hrs:  BP Temp Pulse Resp SpO2 Height Weight  05/14/19 0153 -- -- (!) 112 -- 100 % -- --  05/14/19 0152 99/64 -- (!) 117 -- -- -- --  05/14/19 0149 -- (!) 97.3 F (36.3 C) -- 20 -- 5\' 9"  (1.753 m) 103 kg   Constitutional: Well-developed, well-nourished female  in no acute distress.  Cardiovascular: normal rate and rhythm Respiratory: normal effort, clear to auscultation bilaterally GI: Abd soft, non-tender, gravid appropriate for gestational age.   No rebound or guarding. MS: Extremities nontender, no edema, normal ROM Neurologic: Alert and oriented x 4.  GU: Neg CVAT.  PELVIC EXAM:  Dilation: 2 Effacement (%): 50 Station: -3 Exam by:: Lauren Cox RN   FHT:  Baseline 140 , moderate variability, accelerations present, no decelerations Contractions:  Irregular    Labs: No results found for this or any previous visit (from the past 24 hour(s)).  Imaging:  No results found.  MAU Course/MDM: NST reviewed, reactive with average  variability and + accelerations.  No decelerations.   Fetus audibly active.  Treatments in MAU included EFM.    Assessment: Single intrauterine pregnancy at [redacted]w[redacted]d Decreased fetal movement Preterm uterine contractions, no change in cervix over time Reactive fetal heartrate tracing, Category I  Plan: Discharge home Labor precautions and fetal kick counts Follow up in Office for prenatal visits and recheck of cervix  Encouraged to return here or to other Urgent Care/ED if she develops worsening of symptoms, increase in pain, fever, or other concerning symptoms.   Pt stable at time of discharge.  Wynelle Bourgeois CNM, MSN Certified Nurse-Midwife 05/14/2019 2:15 AM

## 2019-05-14 NOTE — MAU Note (Signed)
No FM for couple hours. Some pelvic pressure and occ ctxs. Denies LOF or vag bleeding

## 2019-05-14 NOTE — Discharge Instructions (Signed)
Preterm Labor and Birth Information ° °The normal length of a pregnancy is 39-41 weeks. Preterm labor is when labor starts before 37 completed weeks of pregnancy. °What are the risk factors for preterm labor? °Preterm labor is more likely to occur in women who: °· Have certain infections during pregnancy such as a bladder infection, sexually transmitted infection, or infection inside the uterus (chorioamnionitis). °· Have a shorter-than-normal cervix. °· Have gone into preterm labor before. °· Have had surgery on their cervix. °· Are younger than age 17 or older than age 35. °· Are African American. °· Are pregnant with twins or multiple babies (multiple gestation). °· Take street drugs or smoke while pregnant. °· Do not gain enough weight while pregnant. °· Became pregnant shortly after having been pregnant. °What are the symptoms of preterm labor? °Symptoms of preterm labor include: °· Cramps similar to those that can happen during a menstrual period. The cramps may happen with diarrhea. °· Pain in the abdomen or lower back. °· Regular uterine contractions that may feel like tightening of the abdomen. °· A feeling of increased pressure in the pelvis. °· Increased watery or bloody mucus discharge from the vagina. °· Water breaking (ruptured amniotic sac). °Why is it important to recognize signs of preterm labor? °It is important to recognize signs of preterm labor because babies who are born prematurely may not be fully developed. This can put them at an increased risk for: °· Long-term (chronic) heart and lung problems. °· Difficulty immediately after birth with regulating body systems, including blood sugar, body temperature, heart rate, and breathing rate. °· Bleeding in the brain. °· Cerebral palsy. °· Learning difficulties. °· Death. °These risks are highest for babies who are born before 34 weeks of pregnancy. °How is preterm labor treated? °Treatment depends on the length of your pregnancy, your condition,  and the health of your baby. It may involve: °· Having a stitch (suture) placed in your cervix to prevent your cervix from opening too early (cerclage). °· Taking or being given medicines, such as: °? Hormone medicines. These may be given early in pregnancy to help support the pregnancy. °? Medicine to stop contractions. °? Medicines to help mature the baby’s lungs. These may be prescribed if the risk of delivery is high. °? Medicines to prevent your baby from developing cerebral palsy. °If the labor happens before 34 weeks of pregnancy, you may need to stay in the hospital. °What should I do if I think I am in preterm labor? °If you think that you are going into preterm labor, call your health care provider right away. °How can I prevent preterm labor in future pregnancies? °To increase your chance of having a full-term pregnancy: °· Do not use any tobacco products, such as cigarettes, chewing tobacco, and e-cigarettes. If you need help quitting, ask your health care provider. °· Do not use street drugs or medicines that have not been prescribed to you during your pregnancy. °· Talk with your health care provider before taking any herbal supplements, even if you have been taking them regularly. °· Make sure you gain a healthy amount of weight during your pregnancy. °· Watch for infection. If you think that you might have an infection, get it checked right away. °· Make sure to tell your health care provider if you have gone into preterm labor before. °This information is not intended to replace advice given to you by your health care provider. Make sure you discuss any questions you have with your   health care provider. °Document Released: 08/04/2003 Document Revised: 09/05/2018 Document Reviewed: 10/05/2015 °Elsevier Patient Education © 2020 [employer] ° °

## 2019-05-15 ENCOUNTER — Inpatient Hospital Stay (HOSPITAL_COMMUNITY)
Admission: AD | Admit: 2019-05-15 | Discharge: 2019-05-15 | Disposition: A | Discharge status: Home / Self Care | Payer: Medicaid Other | Attending: Obstetrics & Gynecology | Admitting: Obstetrics & Gynecology

## 2019-05-15 ENCOUNTER — Encounter (HOSPITAL_COMMUNITY): Payer: Self-pay | Admitting: Obstetrics & Gynecology

## 2019-05-15 ENCOUNTER — Other Ambulatory Visit: Payer: Self-pay

## 2019-05-15 DIAGNOSIS — O471 False labor at or after 37 completed weeks of gestation: Secondary | ICD-10-CM | POA: Diagnosis present

## 2019-05-15 DIAGNOSIS — Z3A36 36 weeks gestation of pregnancy: Secondary | ICD-10-CM | POA: Diagnosis not present

## 2019-05-15 DIAGNOSIS — O4703 False labor before 37 completed weeks of gestation, third trimester: Secondary | ICD-10-CM | POA: Diagnosis not present

## 2019-05-15 DIAGNOSIS — O09523 Supervision of elderly multigravida, third trimester: Secondary | ICD-10-CM | POA: Insufficient documentation

## 2019-05-15 LAB — POCT FERN TEST: POCT Fern Test: NEGATIVE

## 2019-05-15 NOTE — MAU Provider Note (Signed)
S: Ms. Casey Duncan is a 38 y.o. [phone] at [redacted]w[redacted]d  who presents to MAU today complaining of leaking of fluid one time at ~ 2100 tonight, but none since then. She endorses "passing lots of mucous" yesterday and some bloody show this morning. She denies vaginal bleeding now. She endorses contractions. She reports normal fetal movement.    O: LMP 09/04/2018  GENERAL: Well-developed, well-nourished female in no acute distress.  HEAD: Normocephalic, atraumatic.  CHEST: Normal effort of breathing, regular heart rate ABDOMEN: Soft, nontender, gravid PELVIC: Normal external female genitalia. Vagina is pink and rugated. Cervix with normal contour, no lesions. Normal discharge.  Negative pooling.   Cervical exam:  Dilation: 2 Effacement (%): 50 Cervical Position: Posterior Station: -3 Presentation: Vertex Exam by:: Lauren Cox RN    Fetal Monitoring: Baseline: 145 Variability: moderate Accelerations: present Decelerations: absent Contractions: irregular UC's with UI noted  Results for orders placed or performed during the hospital encounter of 05/15/19 (from the past 24 hour(s))  Fern Test     Status: Normal   Collection Time: 05/15/19 10:06 PM  Result Value Ref Range   POCT Fern Test Negative = intact amniotic membranes      A: [facility] at [redacted]w[redacted]d  Membranes intact False Labor  P: Discharge home Instructions for false labor given Keep scheduled appointment with [facility] on 05/19/2019 Patient verbalized an understanding of the plan of care and agrees.    Laury Deep, CNM 05/15/2019, 10:14 PM

## 2019-05-15 NOTE — MAU Note (Signed)
Pt presents to MAU c/o ctx every 2-3 min. No bleeding. Possible SROM @ 2110? Pt is unsure if it was urine or if it was water no leaking since then. Pt is unsure of FM due to her painful ctx.

## 2019-05-15 NOTE — MAU Note (Signed)
Pt left without signing signature pad or d/c instructions.

## 2019-05-20 ENCOUNTER — Telehealth (HOSPITAL_COMMUNITY): Payer: Self-pay | Admitting: *Deleted

## 2019-05-20 ENCOUNTER — Encounter (HOSPITAL_COMMUNITY): Payer: Self-pay | Admitting: *Deleted

## 2019-05-20 NOTE — Telephone Encounter (Signed)
Preadmission screen  

## 2019-05-21 ENCOUNTER — Other Ambulatory Visit: Payer: Self-pay | Admitting: Obstetrics & Gynecology

## 2019-05-23 ENCOUNTER — Other Ambulatory Visit (HOSPITAL_COMMUNITY)
Admission: RE | Admit: 2019-05-23 | Discharge: 2019-05-23 | Disposition: A | Discharge status: Home / Self Care | Payer: Medicaid Other | Source: Ambulatory Visit | Attending: Obstetrics & Gynecology | Admitting: Obstetrics & Gynecology

## 2019-05-23 DIAGNOSIS — Z01812 Encounter for preprocedural laboratory examination: Secondary | ICD-10-CM | POA: Insufficient documentation

## 2019-05-23 DIAGNOSIS — Z20828 Contact with and (suspected) exposure to other viral communicable diseases: Secondary | ICD-10-CM | POA: Insufficient documentation

## 2019-05-23 LAB — SARS CORONAVIRUS 2 (TAT 6-24 HRS): SARS Coronavirus 2: NEGATIVE

## 2019-05-25 ENCOUNTER — Inpatient Hospital Stay (HOSPITAL_COMMUNITY)
Admission: AD | Admit: 2019-05-25 | Discharge: 2019-05-27 | DRG: 807 | Disposition: A | Discharge status: Home / Self Care | Payer: Medicaid Other | Attending: Obstetrics & Gynecology | Admitting: Obstetrics & Gynecology

## 2019-05-25 ENCOUNTER — Inpatient Hospital Stay (HOSPITAL_COMMUNITY): Payer: Medicaid Other

## 2019-05-25 ENCOUNTER — Inpatient Hospital Stay (HOSPITAL_COMMUNITY): Payer: Medicaid Other | Admitting: Anesthesiology

## 2019-05-25 ENCOUNTER — Other Ambulatory Visit: Payer: Self-pay

## 2019-05-25 ENCOUNTER — Encounter (HOSPITAL_COMMUNITY): Payer: Self-pay | Admitting: Obstetrics & Gynecology

## 2019-05-25 DIAGNOSIS — O99334 Smoking (tobacco) complicating childbirth: Secondary | ICD-10-CM | POA: Diagnosis present

## 2019-05-25 DIAGNOSIS — D649 Anemia, unspecified: Secondary | ICD-10-CM | POA: Diagnosis present

## 2019-05-25 DIAGNOSIS — Z3A38 38 weeks gestation of pregnancy: Secondary | ICD-10-CM

## 2019-05-25 DIAGNOSIS — O9902 Anemia complicating childbirth: Secondary | ICD-10-CM | POA: Diagnosis present

## 2019-05-25 DIAGNOSIS — F17210 Nicotine dependence, cigarettes, uncomplicated: Secondary | ICD-10-CM | POA: Diagnosis present

## 2019-05-25 DIAGNOSIS — O24410 Gestational diabetes mellitus in pregnancy, diet controlled: Secondary | ICD-10-CM | POA: Diagnosis present

## 2019-05-25 DIAGNOSIS — O24420 Gestational diabetes mellitus in childbirth, diet controlled: Principal | ICD-10-CM | POA: Diagnosis present

## 2019-05-25 DIAGNOSIS — O471 False labor at or after 37 completed weeks of gestation: Secondary | ICD-10-CM

## 2019-05-25 LAB — CBC
HCT: 27.4 % — ABNORMAL LOW (ref 36.0–46.0)
Hemoglobin: 9.1 g/dL — ABNORMAL LOW (ref 12.0–15.0)
MCH: 30.1 pg (ref 26.0–34.0)
MCHC: 33.2 g/dL (ref 30.0–36.0)
MCV: 90.7 fL (ref 80.0–100.0)
Platelets: 321 10*3/uL (ref 150–400)
RBC: 3.02 MIL/uL — ABNORMAL LOW (ref 3.87–5.11)
RDW: 14.2 % (ref 11.5–15.5)
WBC: 15 10*3/uL — ABNORMAL HIGH (ref 4.0–10.5)
nRBC: 0 % (ref 0.0–0.2)

## 2019-05-25 LAB — GLUCOSE, CAPILLARY
Glucose-Capillary: 101 mg/dL — ABNORMAL HIGH (ref 70–99)
Glucose-Capillary: 101 mg/dL — ABNORMAL HIGH (ref 70–99)
Glucose-Capillary: 56 mg/dL — ABNORMAL LOW (ref 70–99)
Glucose-Capillary: 62 mg/dL — ABNORMAL LOW (ref 70–99)
Glucose-Capillary: 75 mg/dL (ref 70–99)

## 2019-05-25 LAB — TYPE AND SCREEN
ABO/RH(D): O POS
Antibody Screen: NEGATIVE

## 2019-05-25 LAB — ABO/RH: ABO/RH(D): O POS

## 2019-05-25 LAB — RPR: RPR Ser Ql: NONREACTIVE

## 2019-05-25 MED ORDER — OXYTOCIN 40 UNITS IN NORMAL SALINE INFUSION - SIMPLE MED
2.5000 [IU]/h | INTRAVENOUS | Status: DC
  Filled 2019-05-25: qty 1000

## 2019-05-25 MED ORDER — EPHEDRINE 5 MG/ML INJ: 10.0000 mg | INTRAVENOUS | Status: DC | PRN

## 2019-05-25 MED ORDER — OXYCODONE-ACETAMINOPHEN 5-325 MG PO TABS: 2.0000 | ORAL_TABLET | ORAL | Status: DC | PRN

## 2019-05-25 MED ORDER — LACTATED RINGERS IV SOLN: 500.0000 mL | INTRAVENOUS | Status: DC | PRN

## 2019-05-25 MED ORDER — OXYTOCIN BOLUS FROM INFUSION
500.0000 mL | Freq: Once | INTRAVENOUS | Status: AC
  Administered 2019-05-25: 500 mL via INTRAVENOUS

## 2019-05-25 MED ORDER — DIBUCAINE (PERIANAL) 1 % EX OINT: 1.0000 "application " | TOPICAL_OINTMENT | CUTANEOUS | Status: DC | PRN

## 2019-05-25 MED ORDER — ONDANSETRON HCL 4 MG PO TABS: 4.0000 mg | ORAL_TABLET | ORAL | Status: DC | PRN

## 2019-05-25 MED ORDER — BENZOCAINE-MENTHOL 20-0.5 % EX AERO
1.0000 "application " | INHALATION_SPRAY | CUTANEOUS | Status: DC | PRN
  Administered 2019-05-27: 1 via TOPICAL
  Filled 2019-05-25 (×2): qty 56

## 2019-05-25 MED ORDER — OXYTOCIN 40 UNITS IN NORMAL SALINE INFUSION - SIMPLE MED
1.0000 m[IU]/min | INTRAVENOUS | Status: DC
  Administered 2019-05-25: 09:00:00 2 m[IU]/min via INTRAVENOUS

## 2019-05-25 MED ORDER — ZOLPIDEM TARTRATE 5 MG PO TABS: 5.0000 mg | ORAL_TABLET | Freq: Every evening | ORAL | Status: DC | PRN

## 2019-05-25 MED ORDER — IBUPROFEN 600 MG PO TABS
600.0000 mg | ORAL_TABLET | Freq: Four times a day (QID) | ORAL | Status: DC
  Administered 2019-05-25 – 2019-05-27 (×7): 600 mg via ORAL
  Filled 2019-05-25 (×7): qty 1

## 2019-05-25 MED ORDER — ACETAMINOPHEN 325 MG PO TABS: 650.0000 mg | ORAL_TABLET | ORAL | Status: DC | PRN

## 2019-05-25 MED ORDER — TERBUTALINE SULFATE 1 MG/ML IJ SOLN: 0.2500 mg | Freq: Once | INTRAMUSCULAR | Status: DC | PRN

## 2019-05-25 MED ORDER — LACTATED RINGERS IV SOLN: INTRAVENOUS | Status: DC

## 2019-05-25 MED ORDER — NICOTINE 7 MG/24HR TD PT24
7.0000 mg | MEDICATED_PATCH | Freq: Every day | TRANSDERMAL | Status: DC
  Administered 2019-05-25: 7 mg via TRANSDERMAL
  Filled 2019-05-25: qty 1

## 2019-05-25 MED ORDER — DIPHENHYDRAMINE HCL 50 MG/ML IJ SOLN
12.5000 mg | INTRAMUSCULAR | Status: DC | PRN
  Administered 2019-05-25: 12.5 mg via INTRAVENOUS
  Filled 2019-05-25: qty 1

## 2019-05-25 MED ORDER — ONDANSETRON HCL 4 MG/2ML IJ SOLN: 4.0000 mg | Freq: Four times a day (QID) | INTRAMUSCULAR | Status: DC | PRN

## 2019-05-25 MED ORDER — FENTANYL CITRATE (PF) 100 MCG/2ML IJ SOLN
50.0000 ug | INTRAMUSCULAR | Status: DC | PRN
  Administered 2019-05-25: 100 ug via INTRAVENOUS
  Administered 2019-05-25: 10:00:00 50 ug via INTRAVENOUS
  Filled 2019-05-25 (×2): qty 2

## 2019-05-25 MED ORDER — SIMETHICONE 80 MG PO CHEW: 80.0000 mg | CHEWABLE_TABLET | ORAL | Status: DC | PRN

## 2019-05-25 MED ORDER — TETANUS-DIPHTH-ACELL PERTUSSIS 5-2.5-18.5 LF-MCG/0.5 IM SUSP: 0.5000 mL | Freq: Once | INTRAMUSCULAR | Status: DC

## 2019-05-25 MED ORDER — FENTANYL-BUPIVACAINE-NACL 0.5-0.125-0.9 MG/250ML-% EP SOLN
12.0000 mL/h | EPIDURAL | Status: DC | PRN
  Filled 2019-05-25: qty 250

## 2019-05-25 MED ORDER — SODIUM CHLORIDE (PF) 0.9 % IJ SOLN
INTRAMUSCULAR | Status: DC | PRN
  Administered 2019-05-25: 12 mL/h via EPIDURAL

## 2019-05-25 MED ORDER — LIDOCAINE HCL (PF) 1 % IJ SOLN
30.0000 mL | INTRAMUSCULAR | Status: AC | PRN
  Administered 2019-05-25: 7 mL via SUBCUTANEOUS
  Administered 2019-05-25: 5 mL via SUBCUTANEOUS

## 2019-05-25 MED ORDER — SOD CITRATE-CITRIC ACID 500-334 MG/5ML PO SOLN: 30.0000 mL | ORAL | Status: DC | PRN

## 2019-05-25 MED ORDER — SENNOSIDES-DOCUSATE SODIUM 8.6-50 MG PO TABS
2.0000 | ORAL_TABLET | ORAL | Status: DC
  Administered 2019-05-25 – 2019-05-27 (×2): 2 via ORAL
  Filled 2019-05-25 (×2): qty 2

## 2019-05-25 MED ORDER — PHENYLEPHRINE 40 MCG/ML (10ML) SYRINGE FOR IV PUSH (FOR BLOOD PRESSURE SUPPORT): 80.0000 ug | PREFILLED_SYRINGE | INTRAVENOUS | Status: DC | PRN

## 2019-05-25 MED ORDER — COCONUT OIL OIL: 1.0000 "application " | TOPICAL_OIL | Status: DC | PRN

## 2019-05-25 MED ORDER — LACTATED RINGERS IV SOLN: 500.0000 mL | Freq: Once | INTRAVENOUS | Status: DC

## 2019-05-25 MED ORDER — PRENATAL MULTIVITAMIN CH
1.0000 | ORAL_TABLET | Freq: Every day | ORAL | Status: DC
  Administered 2019-05-26 – 2019-05-27 (×2): 1 via ORAL
  Filled 2019-05-25 (×2): qty 1

## 2019-05-25 MED ORDER — ONDANSETRON HCL 4 MG/2ML IJ SOLN: 4.0000 mg | INTRAMUSCULAR | Status: DC | PRN

## 2019-05-25 MED ORDER — OXYCODONE-ACETAMINOPHEN 5-325 MG PO TABS
1.0000 | ORAL_TABLET | ORAL | Status: DC | PRN
  Administered 2019-05-25 – 2019-05-27 (×4): 1 via ORAL
  Filled 2019-05-25 (×4): qty 1

## 2019-05-25 MED ORDER — WITCH HAZEL-GLYCERIN EX PADS: 1.0000 "application " | MEDICATED_PAD | CUTANEOUS | Status: DC | PRN

## 2019-05-25 MED ORDER — DIPHENHYDRAMINE HCL 25 MG PO CAPS
25.0000 mg | ORAL_CAPSULE | Freq: Four times a day (QID) | ORAL | Status: DC | PRN
  Administered 2019-05-25: 20:00:00 25 mg via ORAL
  Filled 2019-05-25: qty 1

## 2019-05-25 NOTE — Progress Notes (Signed)
cbg 56, another juice given, will recheck. Pt asymptomatic.

## 2019-05-25 NOTE — Anesthesia Preprocedure Evaluation (Signed)
Anesthesia Evaluation  Patient identified by MRN, date of birth, ID band Patient awake    Reviewed: Allergy & Precautions, H&P , NPO status , Patient's Chart, lab work & pertinent test results  Airway Mallampati: II  TM Distance: >3 FB Neck ROM: full    Dental no notable dental hx. (+) Teeth Intact   Pulmonary Current Smoker,    Pulmonary exam normal breath sounds clear to auscultation       Cardiovascular negative cardio ROS Normal cardiovascular exam Rhythm:regular Rate:Normal     Neuro/Psych negative neurological ROS  negative psych ROS   GI/Hepatic negative GI ROS, Neg liver ROS,   Endo/Other  negative endocrine ROSdiabetes, Gestational  Renal/GU negative Renal ROS     Musculoskeletal   Abdominal (+) + obese,   Peds  Hematology  (+) Blood dyscrasia, anemia ,   Anesthesia Other Findings   Reproductive/Obstetrics (+) Pregnancy                             Anesthesia Physical Anesthesia Plan  ASA: II  Anesthesia Plan: Epidural   Post-op Pain Management:    Induction:   PONV Risk Score and Plan:   Airway Management Planned:   Additional Equipment:   Intra-op Plan:   Post-operative Plan:   Informed Consent: I have reviewed the patients History and Physical, chart, labs and discussed the procedure including the risks, benefits and alternatives for the proposed anesthesia with the patient or authorized representative who has indicated his/her understanding and acceptance.       Plan Discussed with:   Anesthesia Plan Comments:         Anesthesia Quick Evaluation

## 2019-05-25 NOTE — Anesthesia Procedure Notes (Signed)
Epidural Patient location during procedure: OB Start time: 05/25/2019 1:47 PM End time: 05/25/2019 1:49 PM  Staffing Anesthesiologist: Lyn Hollingshead, MD Performed: anesthesiologist   Preanesthetic Checklist Completed: patient identified, IV checked, site marked, risks and benefits discussed, surgical consent, monitors and equipment checked, pre-op evaluation and timeout performed  Epidural Patient position: sitting Prep: DuraPrep and site prepped and draped Patient monitoring: continuous pulse ox and blood pressure Approach: midline Location: L3-L4 Injection technique: LOR air  Needle:  Needle type: Tuohy  Needle gauge: 17 G Needle length: 9 cm and 9 Needle insertion depth: 6 cm Catheter type: closed end flexible Catheter size: 19 Gauge Catheter at skin depth: 11 cm Test dose: negative and Other  Assessment Events: blood not aspirated, injection not painful, no injection resistance, no paresthesia and negative IV test  Additional Notes Reason for block:procedure for pain

## 2019-05-25 NOTE — H&P (Signed)
Casey Duncan is a 38 y.o. female [phone] at 44 weeks presenting for induction of labor for uncontrolled gestational diabetes. OB History    Gravida  4   Para  3   Term  3   Preterm      AB  0   Living  2     SAB      TAB      Ectopic      Multiple      Live Births  3        Obstetric Comments  Baby passed @ 16 months in 2006       Past Medical History:  Diagnosis Date  . Abnormal vaginal bleeding   . Anemia   . Gestational diabetes    Past Surgical History:  Procedure Laterality Date  . BREAST SURGERY    . lipo 360    . vaginal rejuvenation     Family History: family history includes Healthy in her father and mother. Social History:  reports that she has been smoking. She has been smoking about 2.00 packs per day. She has never used smokeless tobacco. She reports previous drug use. Drug: Marijuana. She reports that she does not drink alcohol.     Maternal Diabetes: Yes:  Diabetes Type:  Diet controlled - non compliant Genetic Screening: Normal Maternal Ultrasounds/Referrals: Normal Fetal Ultrasounds or other Referrals:  None Maternal Substance Abuse:  No Significant Maternal Medications:  Meds include: Other: valtrex Significant Maternal Lab Results:  Group B Strep negative Other Comments:  None  Review of Systems  Constitutional: Negative.   Eyes: Negative.   Respiratory: Negative.   Cardiovascular: Negative.   Endocrine: Negative.   Genitourinary: Negative.   Allergic/Immunologic: Negative.   Neurological: Negative.   All other systems reviewed and are negative.  Maternal Medical History:  Contractions: Onset was 1-2 hours ago.   Frequency: regular.   Perceived severity is moderate.   Induction of labor  Fetal activity: Perceived fetal activity is normal.   Last perceived fetal movement was within the past 12 hours.    Prenatal Complications - Diabetes: gestational. Diabetes is managed by diet.      Dilation: 2.5 Effacement (%):  50, 60 Station: -3 Exam by:: stone rnc Blood pressure 119/61, pulse 98, temperature 97.8 F (36.6 C), temperature source Axillary, resp. rate 16, height 5\' 9"  (1.753 m), weight 103.4 kg, last menstrual period 09/04/2018. Maternal Exam:  Uterine Assessment: Contraction strength is mild.  Contraction frequency is irregular.   Abdomen: Fundal height is 38 cm.   Estimated fetal weight is 2900 grams .   Fetal presentation: vertex  Introitus: Normal vulva. Normal vagina.  Ferning test: not done.  Nitrazine test: not done. Amniotic fluid character: not assessed.  Pelvis: adequate for delivery.   Cervix: Cervix evaluated by digital exam.   2/50/-2 by RN  Fetal Exam Fetal Monitor Review: Mode: hand-held doppler probe.   Baseline rate: 135.  Variability: moderate (6-25 bpm).   Pattern: accelerations present and no decelerations.    Fetal State Assessment: Category I - tracings are normal.     Physical Exam  Nursing note and vitals reviewed. Constitutional: She appears well-developed and well-nourished.  Genitourinary:    Vulva normal.      Prenatal labs: ABO, Rh: --/--/PENDING (12/28 0813) Antibody: PENDING (12/28 0813) Rubella: Immune (06/08 0000) RPR: Non Reactive (08/12 1518)  HBsAg: Negative (06/08 0000)  HIV: Non Reactive (08/12 1518)  GBS: Negative/-- (12/17 0000)   Assessment/Plan: 38 year  old G5P3 at 49 weeks with gestational diabetes, not controlled  Admitted to Labor and Delivery for Induction of labor Will start induction with pitocin Continuous monitoring IV meds for pain control, epidural on maternal demand   Al Bracewell 05/25/2019, 9:34 AM

## 2019-05-25 NOTE — Progress Notes (Signed)
cbg 62. 4 oz juice given

## 2019-05-25 NOTE — Plan of Care (Signed)
  Problem: Education: Goal: Knowledge of General Education information will improve Description: Including pain rating scale, medication(s)/side effects and non-pharmacologic comfort measures Outcome: Completed/Met   Problem: Activity: Goal: Risk for activity intolerance will decrease Outcome: Completed/Met   Problem: Nutrition: Goal: Adequate nutrition will be maintained Outcome: Completed/Met   Problem: Safety: Goal: Ability to remain free from injury will improve Outcome: Completed/Met

## 2019-05-26 ENCOUNTER — Encounter (HOSPITAL_COMMUNITY): Payer: Self-pay | Admitting: Obstetrics & Gynecology

## 2019-05-26 LAB — CBC
HCT: 24.2 % — ABNORMAL LOW (ref 36.0–46.0)
Hemoglobin: 7.9 g/dL — ABNORMAL LOW (ref 12.0–15.0)
MCH: 29.6 pg (ref 26.0–34.0)
MCHC: 32.6 g/dL (ref 30.0–36.0)
MCV: 90.6 fL (ref 80.0–100.0)
Platelets: 286 10*3/uL (ref 150–400)
RBC: 2.67 MIL/uL — ABNORMAL LOW (ref 3.87–5.11)
RDW: 14.2 % (ref 11.5–15.5)
WBC: 14.7 10*3/uL — ABNORMAL HIGH (ref 4.0–10.5)
nRBC: 0 % (ref 0.0–0.2)

## 2019-05-26 NOTE — Progress Notes (Signed)
At 1415, nurse tech went in to bathe infant and found mom had wrapped her in her kpad because she was concerned she was cold although I had taken her temperature twice at 98.2 and explained newborn reflexes. Encouraged mom to never use her K pad for the baby and teaching done for infants inability to regulate heat the way we do. Bath given and infant skin to skin.

## 2019-05-26 NOTE — Progress Notes (Signed)
Discussed ordered CBGs with patient. Pt refuses CBGs.

## 2019-05-26 NOTE — Plan of Care (Signed)
  Problem: Pain Managment: Goal: General experience of comfort will improve Outcome: Progressing Note: Pt pain has been managed adequately with scheduled Motrin, prn percocet, & heating pads.   Problem: Coping: Goal: Ability to identify and utilize available resources and services will improve Outcome: Progressing   Problem: Life Cycle: Goal: Chance of risk for complications during the postpartum period will decrease Outcome: Progressing   Problem: Role Relationship: Goal: Ability to demonstrate positive interaction with newborn will improve Outcome: Progressing

## 2019-05-26 NOTE — Anesthesia Postprocedure Evaluation (Signed)
Anesthesia Post Note  Patient: Casey Duncan  Procedure(s) Performed: AN AD HOC LABOR EPIDURAL     Patient location during evaluation: Mother Baby Anesthesia Type: Epidural Level of consciousness: awake and alert Pain management: pain level controlled Vital Signs Assessment: post-procedure vital signs reviewed and stable Respiratory status: spontaneous breathing, nonlabored ventilation and respiratory function stable Cardiovascular status: stable Postop Assessment: no headache, no backache and epidural receding Anesthetic complications: no Comments: Per telephone conversation.    Last Vitals:  Vitals:   05/25/19 2358 05/26/19 0439  BP: 100/66 102/67  Pulse: 86 84  Resp: 18   Temp: 36.8 C 36.6 C  SpO2: 99%     Last Pain:  Vitals:   05/26/19 0549  TempSrc:   PainSc: 3    Pain Goal:                   Sandrea Matte

## 2019-05-26 NOTE — Progress Notes (Signed)
PPD# 1 SVD w/ 2nd degree laceration Information for the patient's newborn:  Casey Duncan, Casey Duncan [[id number]]  female    S:   Reports feeling tired and sore Tolerating PO fluid and solids No nausea or vomiting Bleeding is light Pain controlled with PO meds Up ad lib / ambulatory / voiding w/o difficulty Feeding: Bottle    O:   VS: BP 100/66 (BP Location: Right Arm)   Pulse 86   Temp 98.3 F (36.8 C) (Oral)   Resp 18   Ht 5\' 9"  (1.753 m)   Wt 103.4 kg   LMP 09/04/2018   SpO2 99%   Breastfeeding Unknown   BMI 33.65 kg/m   LABS:  Recent Labs    05/25/19 0813  WBC 15.0*  HGB 9.1*  PLT 321   Blood type: --/--/O POS, O POS Performed at [facility], [place] [address] [place], [place] [address]  [phone]) Rubella: Immune (06/08 0000)                      I&O: Intake/Output      12/28 0701 - 12/29 0700   Urine (mL/kg/hr) 1600   Blood 100   Total Output 1700   Net -1700         Physical Exam: Alert and oriented X3 Lungs: Clear and unlabored Heart: regular rate and rhythm / no mumurs Abdomen: soft, non-tender, non-distended  Fundus: firm, non-tender, U-2 Perineum: mildly edematous Lochia: appropriate Extremities: No edema, no calf pain or tenderness    A:  PPD # 1  Normal exam  P:  Routine post partum orders Anticipate D/C on 05/27/19     Arrie Eastern, MSN, CNM 05/26/2019, 4:06 AM

## 2019-05-27 DIAGNOSIS — O24410 Gestational diabetes mellitus in pregnancy, diet controlled: Secondary | ICD-10-CM | POA: Diagnosis present

## 2019-05-27 MED ORDER — IBUPROFEN 600 MG PO TABS: 600.0000 mg | ORAL_TABLET | Freq: Four times a day (QID) | ORAL | 0 refills | Status: DC

## 2019-05-27 MED ORDER — ACETAMINOPHEN 325 MG PO TABS: 650.0000 mg | ORAL_TABLET | ORAL | Status: DC | PRN

## 2019-05-27 NOTE — Discharge Summary (Signed)
OB Discharge Summary     Patient Name: Casey Duncan DOB: [birth year] MRN: [id number]  Date of admission: 05/25/2019 Delivering MD: Essie Hart   Date of discharge: 05/27/2019  Admitting diagnosis: Normal labor and delivery [O80] Intrauterine pregnancy: [redacted]w[redacted]d     Secondary diagnosis:  Active Problems:   SVD (12/28)   Normal labor and delivery  Additional problems:  Patient Active Problem List   Diagnosis Date Noted  . SVD (12/28) 05/27/2019    Priority: Medium  . GDM, class A1 05/27/2019  . Normal labor and delivery 05/25/2019        Discharge diagnosis: Term Pregnancy Delivered and GDM A1                                                                                                Post partum procedures:none  Augmentation: AROM and Pitocin  Complications: None  Hospital course:  Induction of Labor With Vaginal Delivery   38 y.o. yo [phone] at [redacted]w[redacted]d was admitted to the hospital 05/25/2019 for induction of labor.  Indication for induction: A1 DM.  Patient had an uncomplicated labor course as follows: Membrane Rupture Time/Date: 12:52 PM ,05/25/2019   Intrapartum Procedures: Episiotomy: None [1]                                         Lacerations:  2nd degree [3]  Patient had delivery of a Viable infant.  Information for the patient's newborn:  Chaise, Passarella [[id number]]  Delivery Method: Vaginal, Spontaneous(Filed from Delivery Summary)    05/25/2019  Details of delivery can be found in separate delivery note.  Patient had a routine postpartum course. Patient is discharged home 05/27/19.  Physical exam  Vitals:   05/25/19 2358 05/26/19 0439 05/26/19 2203 05/27/19 0532  BP: 100/66 102/67 105/69 109/60  Pulse: 86 84 86 88  Resp: 18  18 18   Temp: 98.3 F (36.8 C) 97.9 F (36.6 C) 98.5 F (36.9 C) 97.8 F (36.6 C)  TempSrc: Oral Oral Oral Oral  SpO2: 99%   100%  Weight:      Height:       General: alert, cooperative and no distress Lochia:  appropriate Uterine Fundus: firm Perineum: well-approximated, non-edematous DVT Evaluation: Negative Homan's sign. No cords or calf tenderness. Labs: Lab Results  Component Value Date   WBC 14.7 (H) 05/26/2019   HGB 7.9 (L) 05/26/2019   HCT 24.2 (L) 05/26/2019   MCV 90.6 05/26/2019   PLT 286 05/26/2019   CMP Latest Ref Rng & Units 04/01/2019  Glucose 70 - 99 mg/dL 88  BUN 6 - 20 mg/dL 6  Creatinine 13/08/2018 - 4.62 mg/dL 7.03  Sodium 5.00 - 938 mmol/L 136  Potassium 3.5 - 5.1 mmol/L 3.3(L)  Chloride 98 - 111 mmol/L 106  CO2 22 - 32 mmol/L 21(L)  Calcium 8.9 - 10.3 mg/dL 182)  Total Protein 6.5 - 8.1 g/dL 6.6  Total Bilirubin 0.3 - 1.2 mg/dL 0.3  Alkaline Phos 38 - 126 U/L  98  AST 15 - 41 U/L 12(L)  ALT 0 - 44 U/L 9    Discharge instruction: per After Visit Summary and "Baby and Me Booklet".  After visit meds:  Allergies as of 05/27/2019      Reactions   Sulfa Antibiotics Itching, Swelling   Throat and tongue itching      Medication List    STOP taking these medications   valACYclovir 500 MG tablet Commonly known as: VALTREX     TAKE these medications   acetaminophen 325 MG tablet Commonly known as: Tylenol Take 2 tablets (650 mg total) by mouth every 4 (four) hours as needed (for pain scale < 4).   ibuprofen 600 MG tablet Commonly known as: ADVIL Take 1 tablet (600 mg total) by mouth every 6 (six) hours.       Diet: carb modified diet  Activity: Advance as tolerated. Pelvic rest for 6 weeks.   Outpatient follow up:6 weeks Follow up Appt:No future appointments. Follow up Visit:No follow-ups on file.  Postpartum contraception: Undecided  Newborn Data: Live born female  Birth Weight: 6 lb 9.5 oz (2990 g) APGAR: 8, 9  Newborn Delivery   Birth date/time: 05/25/2019 16:32:00 Delivery type: Vaginal, Spontaneous      Baby Feeding: Bottle Disposition:home with mother   05/27/2019 Arrie Eastern, CNM

## 2019-07-06 ENCOUNTER — Other Ambulatory Visit: Payer: Self-pay

## 2019-07-06 ENCOUNTER — Encounter (HOSPITAL_COMMUNITY): Payer: Self-pay

## 2019-07-06 ENCOUNTER — Ambulatory Visit (HOSPITAL_COMMUNITY)
Admission: EM | Admit: 2019-07-06 | Discharge: 2019-07-06 | Disposition: A | Discharge status: Home / Self Care | Payer: Medicaid Other | Attending: Internal Medicine | Admitting: Internal Medicine

## 2019-07-06 DIAGNOSIS — N760 Acute vaginitis: Secondary | ICD-10-CM | POA: Diagnosis present

## 2019-07-06 DIAGNOSIS — Z3202 Encounter for pregnancy test, result negative: Secondary | ICD-10-CM

## 2019-07-06 DIAGNOSIS — B9689 Other specified bacterial agents as the cause of diseases classified elsewhere: Secondary | ICD-10-CM | POA: Insufficient documentation

## 2019-07-06 LAB — POCT PREGNANCY, URINE: Preg Test, Ur: NEGATIVE

## 2019-07-06 LAB — POCT URINALYSIS DIP (DEVICE)
Bilirubin Urine: NEGATIVE
Glucose, UA: NEGATIVE mg/dL
Ketones, ur: NEGATIVE mg/dL
Leukocytes,Ua: NEGATIVE
Nitrite: NEGATIVE
Protein, ur: NEGATIVE mg/dL
Specific Gravity, Urine: >=1.03 (ref 1.005–1.030)
Urobilinogen, UA: 0.2 mg/dL (ref 0.0–1.0)
pH: 5 (ref 5.0–8.0)

## 2019-07-06 LAB — POC URINE PREG, ED: Preg Test, Ur: NEGATIVE

## 2019-07-06 MED ORDER — NORGESTIMATE-ETH ESTRADIOL 0.25-35 MG-MCG PO TABS: 1.0000 | ORAL_TABLET | Freq: Every day | ORAL | 11 refills | Status: DC

## 2019-07-06 MED ORDER — METRONIDAZOLE 500 MG PO TABS: 500.0000 mg | ORAL_TABLET | Freq: Two times a day (BID) | ORAL | 0 refills | Status: DC

## 2019-07-06 NOTE — ED Provider Notes (Signed)
[place]    CSN: [id number] Arrival date & time: 07/06/19  0806      History   Chief Complaint Chief Complaint  Patient presents with  . SEXUALLY TRANSMITTED DISEASE    HPI Casey Duncan is a 39 y.o. female with no past medical history comes to urgent care with complaints of foul-smelling vaginal discharge.  Vaginal discharge is minimal.   Symptoms started a week ago.  Patient denies any dysuria urgency or frequency.  No vaginal itching.Patient is sexually active and denies any dyspareunia both superficial and deep. Patient has one sexual partner. Patient has a 69-month old baby and wants to be started on birth control with this visit.  HPI  Past Medical History:  Diagnosis Date  . Abnormal vaginal bleeding   . Anemia   . Gestational diabetes     Patient Active Problem List   Diagnosis Date Noted  . SVD (12/28) 05/27/2019  . GDM, class A1 05/27/2019  . Normal labor and delivery 05/25/2019    Past Surgical History:  Procedure Laterality Date  . BREAST SURGERY    . lipo 360    . vaginal rejuvenation      OB History    Gravida  4   Para  4   Term  4   Preterm      AB  0   Living  3     SAB      TAB      Ectopic      Multiple  0   Live Births  4        Obstetric Comments  Baby passed @ 16 months in 2006         Home Medications    Prior to Admission medications   Medication Sig Start Date End Date Taking? Authorizing Provider  acetaminophen (TYLENOL) 325 MG tablet Take 2 tablets (650 mg total) by mouth every 4 (four) hours as needed (for pain scale < 4). 05/27/19   Arrie Eastern, CNM  metroNIDAZOLE (FLAGYL) 500 MG tablet Take 1 tablet (500 mg total) by mouth 2 (two) times daily. 07/06/19   Chase Picket, MD  norgestimate-ethinyl estradiol ([place] 28) 0.25-35 MG-MCG tablet Take 1 tablet by mouth daily. 07/06/19   Giankarlo Leamer, Myrene Galas, MD    Family History Family History  Problem Relation Age of Onset  . Healthy Mother     . Healthy Father     Social History Social History   Tobacco Use  . Smoking status: Former Smoker    Packs/day: 2.00  . Smokeless tobacco: Never Used  Substance Use Topics  . Alcohol use: No    Comment: Patient denies  . Drug use: Not Currently    Types: Marijuana    Comment: does not use anymore      Allergies   Sulfa antibiotics   Review of Systems Review of Systems  Constitutional: Negative.   HENT: Negative.   Respiratory: Negative.   Gastrointestinal: Negative for abdominal pain, nausea and vomiting.  Genitourinary: Positive for vaginal discharge. Negative for dysuria, frequency and urgency.  Musculoskeletal: Negative for arthralgias and joint swelling.  Skin: Negative.   Neurological: Negative for dizziness, numbness and headaches.  Psychiatric/Behavioral: Negative for confusion and decreased concentration.     Physical Exam Triage Vital Signs ED Triage Vitals  Enc Vitals Group     BP 07/06/19 0858 118/78     Pulse Rate 07/06/19 0858 86     Resp 07/06/19 0858 16  Temp 07/06/19 0858 98 F (36.7 C)     Temp Source 07/06/19 0858 Oral     SpO2 07/06/19 0858 100 %     Weight --      Height --      Head Circumference --      Peak Flow --      Pain Score 07/06/19 0826 0     Pain Loc --      Pain Edu? --      Excl. in GC? --    No data found.  Updated Vital Signs BP 118/78 (BP Location: Left Arm)   Pulse 86   Temp 98 F (36.7 C) (Oral)   Resp 16   SpO2 100%   Visual Acuity Right Eye Distance:   Left Eye Distance:   Bilateral Distance:    Right Eye Near:   Left Eye Near:    Bilateral Near:     Physical Exam Vitals and nursing note reviewed.  Constitutional:      General: She is not in acute distress.    Appearance: Normal appearance. She is not ill-appearing.  Cardiovascular:     Pulses: Normal pulses.     Heart sounds: Normal heart sounds.  Pulmonary:     Effort: Pulmonary effort is normal. No respiratory distress.     Breath  sounds: Normal breath sounds. No wheezing or rhonchi.  Abdominal:     General: Bowel sounds are normal.     Palpations: Abdomen is soft.     Tenderness: There is no abdominal tenderness. There is no guarding or rebound.  Musculoskeletal:        General: No swelling or deformity. Normal range of motion.  Skin:    General: Skin is warm.     Capillary Refill: Capillary refill takes less than 2 seconds.  Neurological:     Mental Status: She is alert.      UC Treatments / Results  Labs (all labs ordered are listed, but only abnormal results are displayed) Labs Reviewed  POCT URINALYSIS DIP (DEVICE) - Abnormal; Notable for the following components:      Result Value   Hgb urine dipstick SMALL (*)    All other components within normal limits  POC URINE PREG, ED  POCT PREGNANCY, URINE  CERVICOVAGINAL ANCILLARY ONLY    EKG   Radiology No results found.  Procedures Procedures (including critical care time)  Medications Ordered in UC Medications - No data to display  Initial Impression / Assessment and Plan / UC Course  I have reviewed the triage vital signs and the nursing notes.  Pertinent labs & imaging results that were available during my care of the patient were reviewed by me and considered in my medical decision making (see chart for details).     1. Vaginal odor likely bacterial vaginosis: Metronidazole 500 mg twice daily for 7 days Cervicovaginal swab for bacterial vaginosis, yeast, GC/chlamydia Urinalysis is remarkable for blood.  Urine pregnancy test is negative. Sprintec 1 month supply.  Patient is advised to follow-up with gynecology for routine care regarding gynecological issues Final Clinical Impressions(s) / UC Diagnoses   Final diagnoses:  Bacterial vaginitis   Discharge Instructions   None    ED Prescriptions    Medication Sig Dispense Auth. Provider   metroNIDAZOLE (FLAGYL) 500 MG tablet Take 1 tablet (500 mg total) by mouth 2 (two) times  daily. 14 tablet Naven Giambalvo, Britta Mccreedy, MD   norgestimate-ethinyl estradiol (SPRINTEC 28) 0.25-35 MG-MCG tablet Take 1 tablet  by mouth daily. 1 Package Aiyden Lauderback, Britta Mccreedy, MD     PDMP not reviewed this encounter.   Merrilee Jansky, MD 07/08/19 [phone]

## 2019-07-06 NOTE — ED Triage Notes (Signed)
Patient presents to Urgent Care with complaints of vaginal odor since about a week ago. Patient reports she has had unprotected sex.

## 2019-07-06 NOTE — ED Notes (Signed)
Patient called x1 with no response 

## 2019-07-08 LAB — CERVICOVAGINAL ANCILLARY ONLY
Bacterial vaginitis: POSITIVE — AB
Candida vaginitis: NEGATIVE
Chlamydia: NEGATIVE
Neisseria Gonorrhea: NEGATIVE
Trichomonas: NEGATIVE

## 2019-10-30 IMAGING — US OBSTETRIC <14 WK ULTRASOUND
1 series · 15 of 28 positions shown · non-contrast
Comparison: None.

CLINICAL DATA: Vaginal bleeding

EXAM:
OBSTETRIC <14 WK US AND TRANSVAGINAL OB US
TECHNIQUE: Both transabdominal and transvaginal ultrasound examinations were
performed for complete evaluation of the gestation as well as the
maternal uterus, adnexal regions, and pelvic cul-de-sac.
Transvaginal technique was performed to assess early pregnancy.

[Series 1: obstetric <14 wk ultrasound · 15 of 76 slices shown]
[im 1/76]
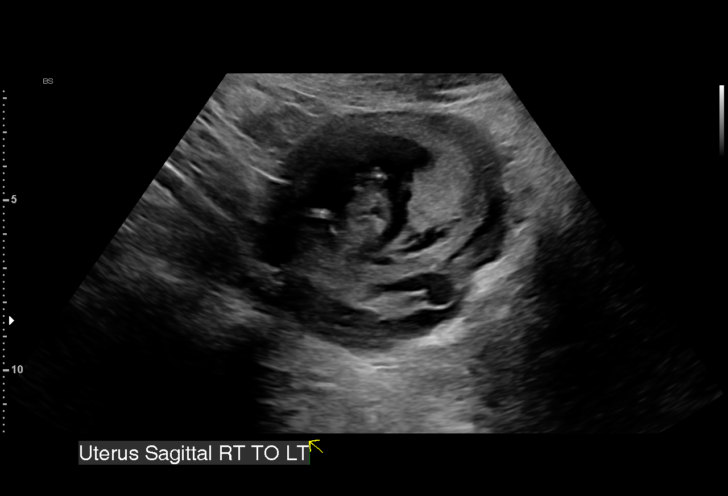
[im 6/76]
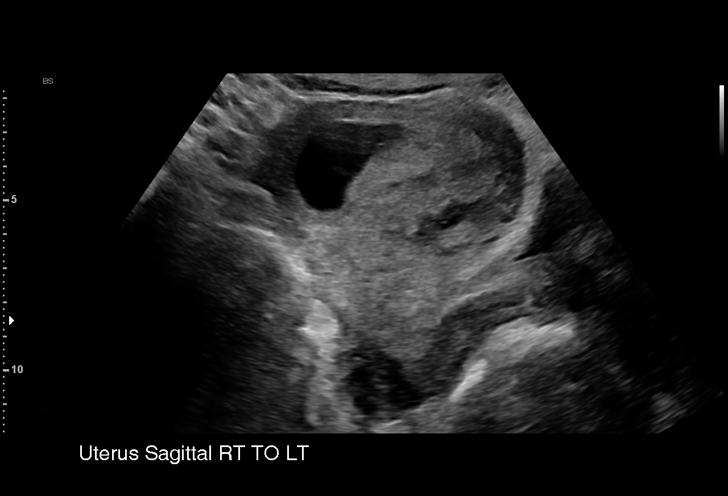
[im 12/76]
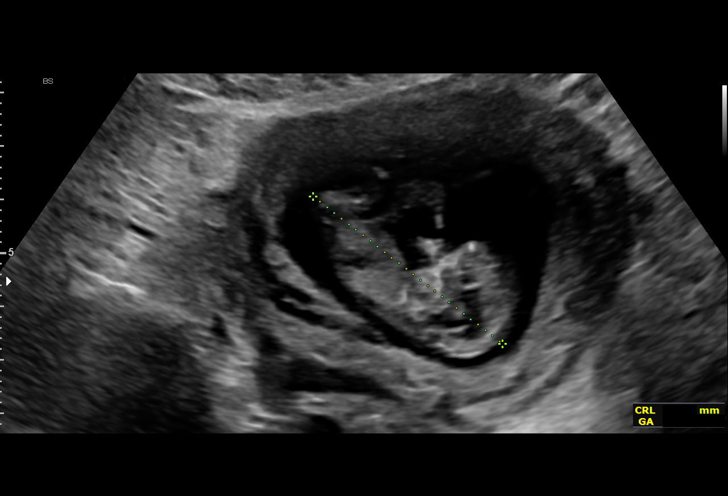
[im 17/76]
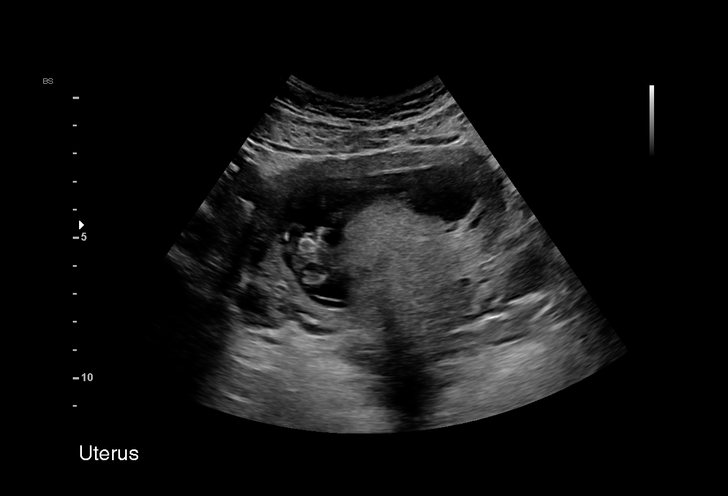
[im 23/76]
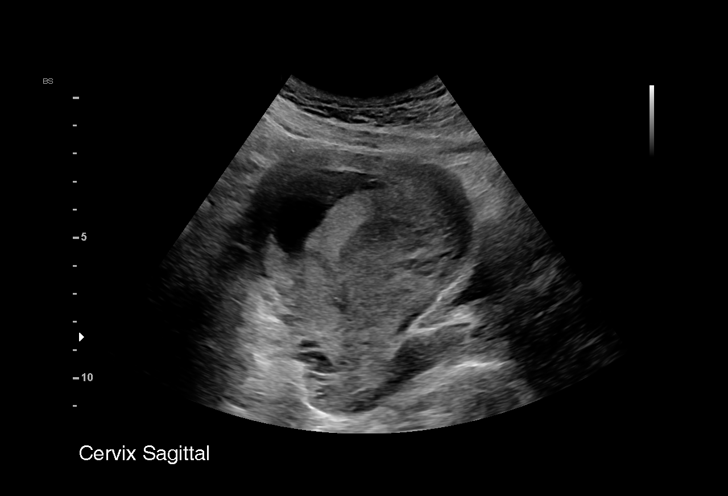
[im 28/76]
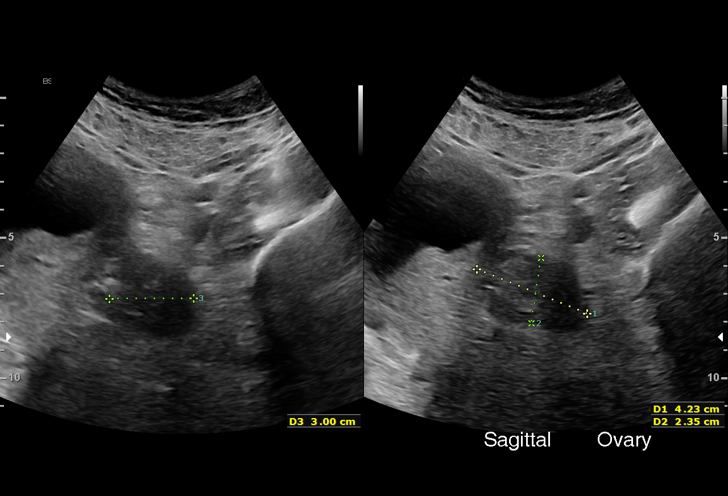
[im 34/76]
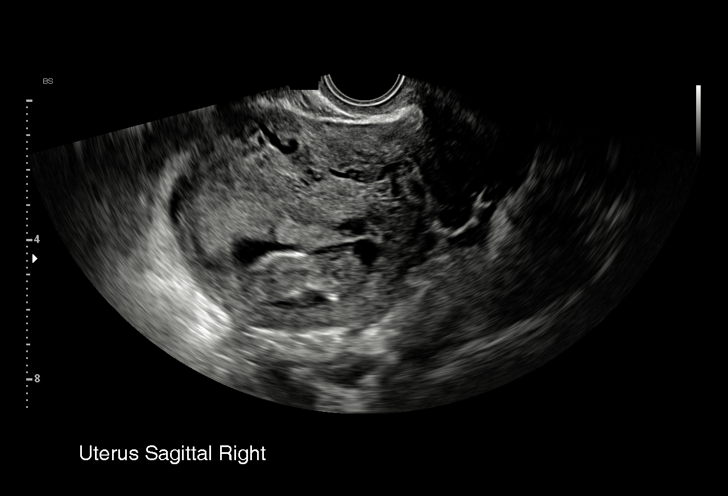
[im 39/76]
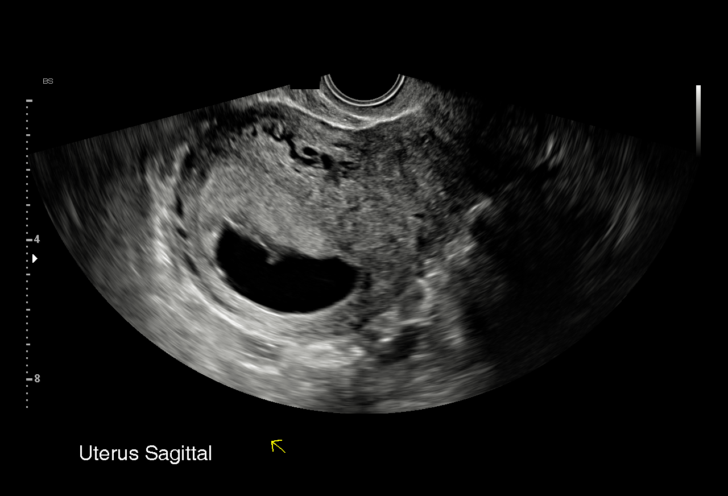
[im 42/76]
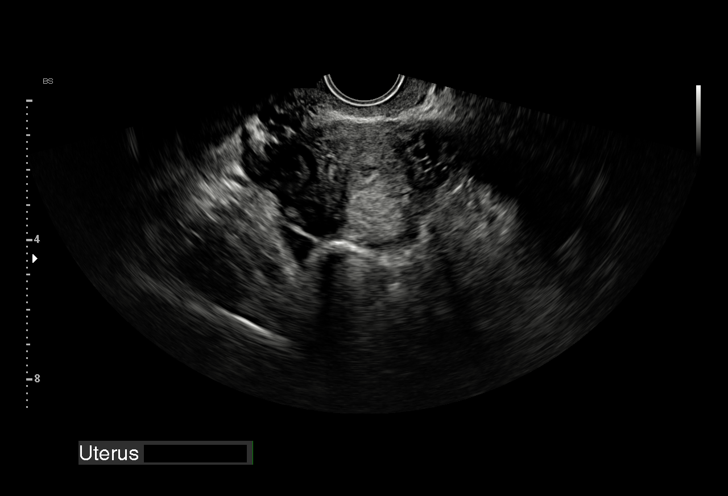
[im 48/76]
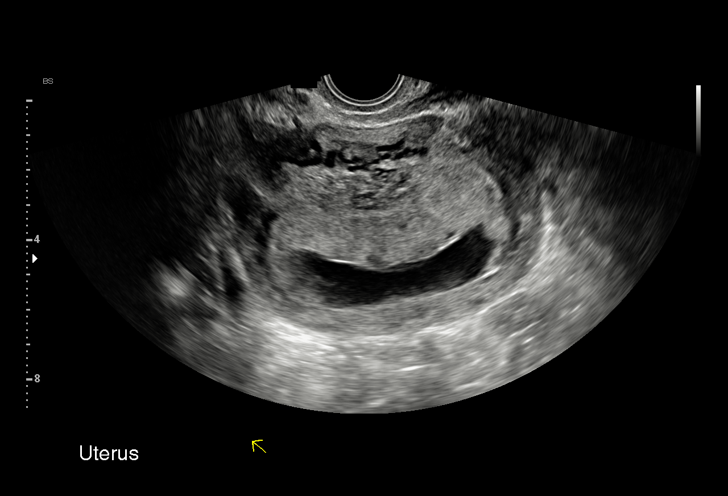
[im 53/76]
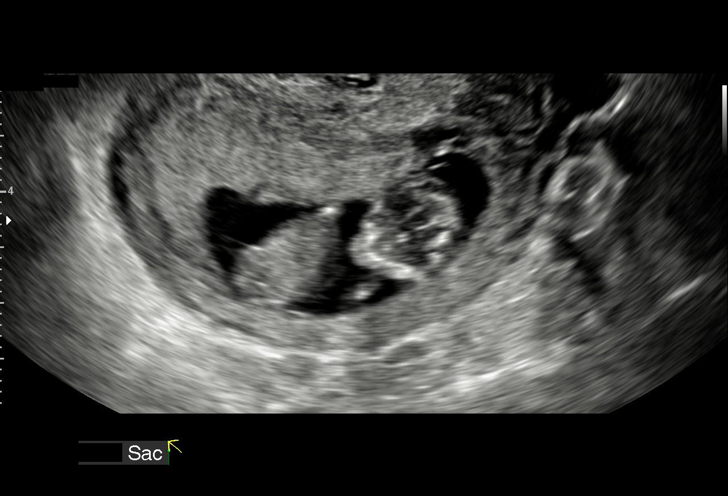
[im 59/76]
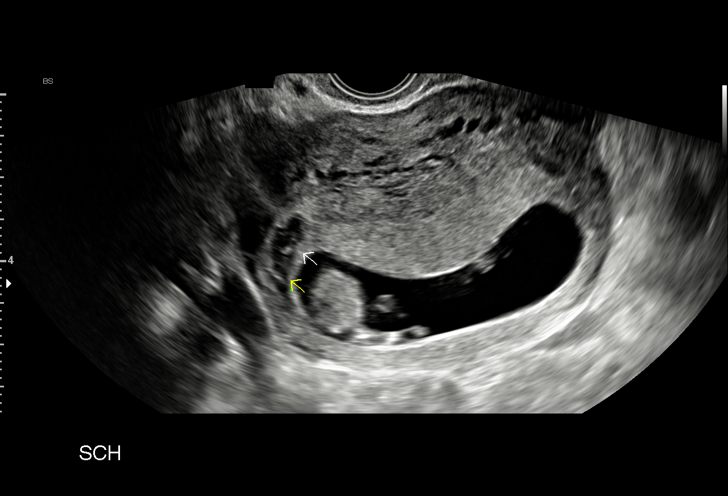
[im 64/76]
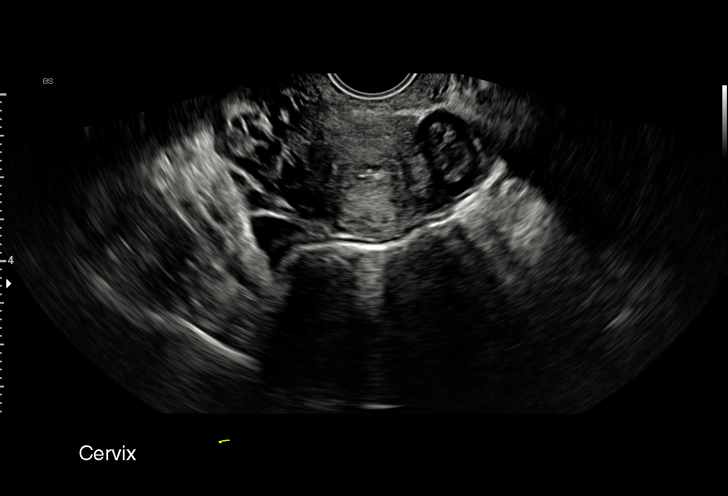
[im 70/76]
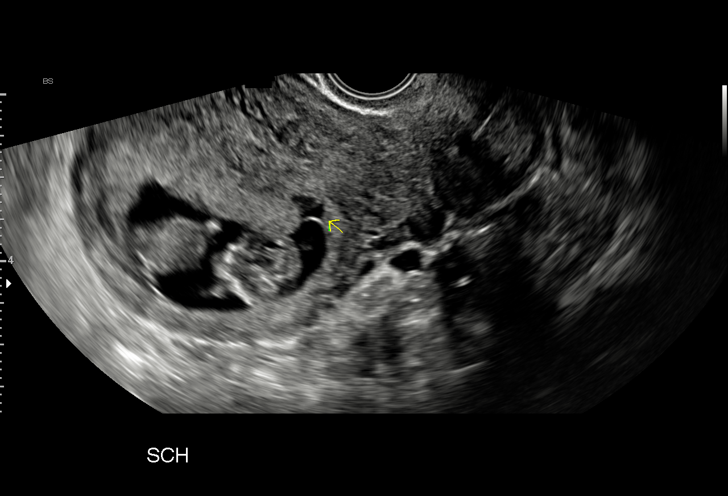
[im 76/76]
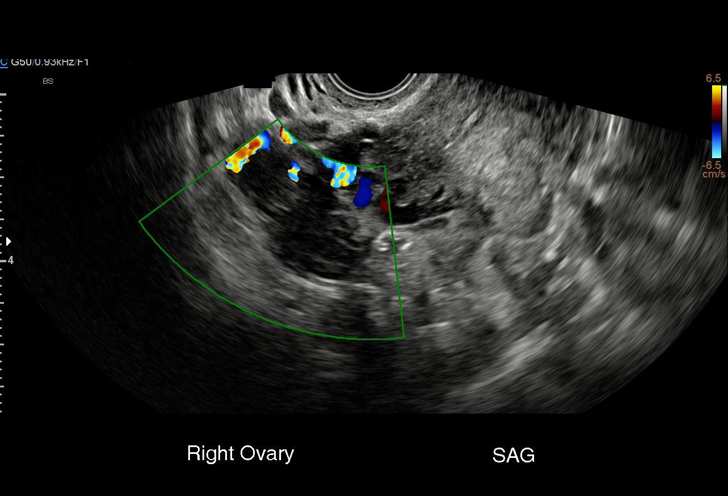

[15 of 28 positions shown; findings below may reference images not displayed]

FINDINGS: Intrauterine gestational sac: Single

Yolk sac:  Visualized.

Embryo:  Visualized.

Cardiac Activity: Visualized.

Heart Rate: 160 bpm

CRL: 42.5 mm   11 w   1 d                  US EDC: 06/06/2019

Subchorionic hemorrhage: Small-to-moderate subchorionic hemorrhage
along the right anterior aspect of the sac.

Maternal uterus/adnexae: Ovaries are normal. The left ovary measures
3.4 x 2.1 x 2 cm. The right ovary measures 2.4 x 3.9 x 2.3 cm. Small
free fluid in the pelvis.
IMPRESSION: 1. Single viable intrauterine pregnancy as above.
2. Small moderate subchorionic hemorrhage.
3. Trace free fluid

## 2019-11-03 ENCOUNTER — Encounter (HOSPITAL_COMMUNITY): Payer: Self-pay | Admitting: Emergency Medicine

## 2019-11-03 ENCOUNTER — Other Ambulatory Visit: Payer: Self-pay

## 2019-11-03 ENCOUNTER — Emergency Department (HOSPITAL_COMMUNITY)
Admission: EM | Admit: 2019-11-03 | Discharge: 2019-11-03 | Disposition: A | Discharge status: Home / Self Care | Payer: Medicaid Other | Attending: Emergency Medicine | Admitting: Emergency Medicine

## 2019-11-03 DIAGNOSIS — N3000 Acute cystitis without hematuria: Secondary | ICD-10-CM | POA: Insufficient documentation

## 2019-11-03 DIAGNOSIS — Z87891 Personal history of nicotine dependence: Secondary | ICD-10-CM | POA: Diagnosis not present

## 2019-11-03 DIAGNOSIS — Z202 Contact with and (suspected) exposure to infections with a predominantly sexual mode of transmission: Secondary | ICD-10-CM | POA: Diagnosis not present

## 2019-11-03 DIAGNOSIS — R8290 Unspecified abnormal findings in urine: Secondary | ICD-10-CM | POA: Diagnosis present

## 2019-11-03 LAB — URINALYSIS, COMPLETE (UACMP) WITH MICROSCOPIC
Bilirubin Urine: NEGATIVE
Glucose, UA: NEGATIVE mg/dL
Ketones, ur: NEGATIVE mg/dL
Nitrite: POSITIVE — AB
Protein, ur: NEGATIVE mg/dL
Renal Epithelial: <1
Specific Gravity, Urine: 1.023 (ref 1.005–1.030)
pH: 5 (ref 5.0–8.0)

## 2019-11-03 LAB — GC/CHLAMYDIA PROBE AMP (~~LOC~~) NOT AT ARMC
Chlamydia: NEGATIVE
Comment: NEGATIVE
Comment: NORMAL
Neisseria Gonorrhea: NEGATIVE

## 2019-11-03 LAB — PREGNANCY, URINE: Preg Test, Ur: NEGATIVE

## 2019-11-03 LAB — WET PREP, GENITAL
Clue Cells Wet Prep HPF POC: NONE SEEN
Sperm: NONE SEEN
Trich, Wet Prep: NONE SEEN
WBC, Wet Prep HPF POC: NONE SEEN
Yeast Wet Prep HPF POC: NONE SEEN

## 2019-11-03 MED ORDER — CEPHALEXIN 500 MG PO CAPS: 500.0000 mg | ORAL_CAPSULE | Freq: Two times a day (BID) | ORAL | 0 refills | Status: DC

## 2019-11-03 NOTE — ED Provider Notes (Signed)
[facility]-EMERGENCY DEPT Provider Note   CSN: [id number] Arrival date & time: 11/03/19  0003     History Chief Complaint  Patient presents with  . SEXUALLY TRANSMITTED DISEASE    Casey Duncan is a 39 y.o. female.  The history is provided by the patient and medical records.    39 y.o. F with hx of anemia, abnormal vaginal bleeding, presenting to the ED for fishy smell to her urine for the past week.  She denies vaginal discharge or irregular bleeding.  Has had some hairbumps on her vagina, however has been getting those for several months now.  She is currently doing laser hair removal.  States she has concern for STD as her boyfriend was unfaithful a year ago which resulted in STD.  Denies abdominal pain, dysuria, urinary frequency, fever, chills, nausea, vomiting.  No meds PTA.  Past Medical History:  Diagnosis Date  . Abnormal vaginal bleeding   . Anemia   . Gestational diabetes     Patient Active Problem List   Diagnosis Date Noted  . SVD (12/28) 05/27/2019  . GDM, class A1 05/27/2019  . Normal labor and delivery 05/25/2019    Past Surgical History:  Procedure Laterality Date  . BREAST SURGERY    . lipo 360    . vaginal rejuvenation       OB History    Gravida  4   Para  4   Term  4   Preterm      AB  0   Living  3     SAB      TAB      Ectopic      Multiple  0   Live Births  4        Obstetric Comments  Baby passed @ 16 months in 2006        Family History  Problem Relation Age of Onset  . Healthy Mother   . Healthy Father     Social History   Tobacco Use  . Smoking status: Former Smoker    Packs/day: 2.00  . Smokeless tobacco: Never Used  Substance Use Topics  . Alcohol use: No    Comment: Patient denies  . Drug use: Not Currently    Types: Marijuana    Comment: does not use anymore     Home Medications Prior to Admission medications   Medication Sig Start Date End Date Taking? Authorizing  Provider  acetaminophen (TYLENOL) 325 MG tablet Take 2 tablets (650 mg total) by mouth every 4 (four) hours as needed (for pain scale < 4). 05/27/19   Roma Schanz, CNM  metroNIDAZOLE (FLAGYL) 500 MG tablet Take 1 tablet (500 mg total) by mouth 2 (two) times daily. 07/06/19   Merrilee Jansky, MD  norgestimate-ethinyl estradiol (SPRINTEC 28) 0.25-35 MG-MCG tablet Take 1 tablet by mouth daily. 07/06/19   Lamptey, Britta Mccreedy, MD    Allergies    Sulfa antibiotics  Review of Systems   Review of Systems  Genitourinary:       Malodorous urine  All other systems reviewed and are negative.   Physical Exam Updated Vital Signs BP (!) 112/93 (BP Location: Right Arm)   Pulse 89   Temp 98.6 F (37 C) (Oral)   Resp 16   Ht 5\' 8"  (1.727 m)   Wt 113.4 kg   LMP 10/03/2019   SpO2 100%   BMI 38.01 kg/m   Physical Exam Vitals and nursing note  reviewed.  Constitutional:      Appearance: She is well-developed.  HENT:     Head: Normocephalic and atraumatic.  Eyes:     Conjunctiva/sclera: Conjunctivae normal.     Pupils: Pupils are equal, round, and reactive to light.  Cardiovascular:     Rate and Rhythm: Normal rate and regular rhythm.     Heart sounds: Normal heart sounds.  Pulmonary:     Effort: Pulmonary effort is normal. No respiratory distress.     Breath sounds: Normal breath sounds. No rhonchi.  Abdominal:     General: Bowel sounds are normal.     Palpations: Abdomen is soft.  Musculoskeletal:        General: Normal range of motion.     Cervical back: Normal range of motion.     Comments: Exam chaperoned by RN Normal female external genitalia, does have some hair bumps noted along mons pubis but no ulcerations or abscess formation, scant amount of thin, white vaginal discharge noted  Skin:    General: Skin is warm and dry.  Neurological:     Mental Status: She is alert and oriented to person, place, and time.     ED Results / Procedures / Treatments   Labs (all labs  ordered are listed, but only abnormal results are displayed) Labs Reviewed  URINALYSIS, COMPLETE (UACMP) WITH MICROSCOPIC - Abnormal; Notable for the following components:      Result Value   Hgb urine dipstick SMALL (*)    Nitrite POSITIVE (*)    Leukocytes,Ua TRACE (*)    Bacteria, UA MANY (*)    All other components within normal limits  WET PREP, GENITAL  PREGNANCY, URINE  GC/CHLAMYDIA PROBE AMP ([place]) NOT AT [facility]    EKG None  Radiology No results found.  Procedures Procedures (including critical care time)  Medications Ordered in ED Medications - No data to display  ED Course  I have reviewed the triage vital signs and the nursing notes.  Pertinent labs & imaging results that were available during my care of the patient were reviewed by me and considered in my medical decision making (see chart for details).    MDM Rules/Calculators/A&P  39 y.o. female presenting to the ED with malodorous urine for the past few days. Denies any dysuria or hematuria. Also is concerned for STD as boyfriend has been unfaithful in the past which is resulted in STD. She denies any noted vaginal discharge. No pelvic pain. She is afebrile nontoxic. Abdomen soft and benign. UA is nitrite positive with many bacteria. No significant findings on pelvic exam. Wet prep is negative. She does have some hair bumps along her mons pubis which have been chronic over the past few months. She is undergoing laser hair removal. No abscess formation or evidence of cellulitis currently.  Recommend that she hold off on further treatments until her skin clears up a little, can try warm compresses at home.  Will treat UTI with keflex.  She can follow-up with her PCP.  Return here for new concerns.  Of note, patient irritated during ER visit.  Prior to performing pelvic exam she asked why the wait was so long and if she could just be called with her results.  I have explained to her that once she leaves no one  follows up on her results aside from gc/chl culture.  She states she has an infant at home with her 62 year old who has to work at [employer].  Her results  came back within about 10-15 minutes, total ER visit time was <2.5 hours.  She is also mad that she was not given bactrim for her "hair bumps".  I have explained to her that these are chronic (6+ months) without signs of overlying infection and I do not feel they require antibiotics.  Patient asking to speak with charge nurse about why she had to wait here for results and why I did not give her bactrim.  Final Clinical Impression(s) / ED Diagnoses Final diagnoses:  Acute cystitis without hematuria    Rx / DC Orders ED Discharge Orders         Ordered    cephALEXin (KEFLEX) 500 MG capsule  2 times daily     11/03/19 0229           Garlon Hatchet, PA-C 11/03/19 0300    Devoria Albe, MD 11/03/19 [phone]

## 2019-11-03 NOTE — Discharge Instructions (Signed)
Take the prescribed medication as directed. Follow-up with your doctor. Return to the ED for new or worsening symptoms. 

## 2019-11-03 NOTE — ED Triage Notes (Signed)
Patient states urine smells fishy and her partner has a hx of being unfaithful. Patient wants a pregnant test and std check.

## 2019-11-03 NOTE — ED Notes (Signed)
Called x 2. No answer 

## 2019-11-28 ENCOUNTER — Other Ambulatory Visit: Payer: Self-pay

## 2019-11-28 ENCOUNTER — Emergency Department (HOSPITAL_COMMUNITY)
Admission: EM | Admit: 2019-11-28 | Discharge: 2019-11-28 | Disposition: A | Discharge status: Home / Self Care | Payer: Medicaid Other | Attending: Emergency Medicine | Admitting: Emergency Medicine

## 2019-11-28 ENCOUNTER — Encounter (HOSPITAL_COMMUNITY): Payer: Self-pay

## 2019-11-28 DIAGNOSIS — Z202 Contact with and (suspected) exposure to infections with a predominantly sexual mode of transmission: Secondary | ICD-10-CM | POA: Diagnosis not present

## 2019-11-28 DIAGNOSIS — E119 Type 2 diabetes mellitus without complications: Secondary | ICD-10-CM | POA: Insufficient documentation

## 2019-11-28 DIAGNOSIS — Z87891 Personal history of nicotine dependence: Secondary | ICD-10-CM | POA: Diagnosis not present

## 2019-11-28 DIAGNOSIS — R82998 Other abnormal findings in urine: Secondary | ICD-10-CM | POA: Insufficient documentation

## 2019-11-28 LAB — URINALYSIS, ROUTINE W REFLEX MICROSCOPIC
Bilirubin Urine: NEGATIVE
Glucose, UA: NEGATIVE mg/dL
Ketones, ur: NEGATIVE mg/dL
Nitrite: POSITIVE — AB
Protein, ur: NEGATIVE mg/dL
Specific Gravity, Urine: 1.018 (ref 1.005–1.030)
pH: 6 (ref 5.0–8.0)

## 2019-11-28 LAB — WET PREP, GENITAL
Clue Cells Wet Prep HPF POC: NONE SEEN
Sperm: NONE SEEN
Trich, Wet Prep: NONE SEEN
Yeast Wet Prep HPF POC: NONE SEEN

## 2019-11-28 LAB — POC URINE PREG, ED: Preg Test, Ur: NEGATIVE

## 2019-11-28 MED ORDER — AZITHROMYCIN 1 G PO PACK
1.0000 g | PACK | Freq: Once | ORAL | Status: AC
  Administered 2019-11-28: 1 g via ORAL
  Filled 2019-11-28: qty 1

## 2019-11-28 MED ORDER — STERILE WATER FOR INJECTION IJ SOLN
INTRAMUSCULAR | Status: AC
  Administered 2019-11-28: 10 mL
  Filled 2019-11-28: qty 10

## 2019-11-28 MED ORDER — CEFTRIAXONE SODIUM 1 G IJ SOLR
500.0000 mg | Freq: Once | INTRAMUSCULAR | Status: AC
  Administered 2019-11-28: 500 mg via INTRAMUSCULAR
  Filled 2019-11-28: qty 10

## 2019-11-28 NOTE — ED Notes (Signed)
Pelvic cart set up and ready at bedside 

## 2019-11-28 NOTE — Discharge Instructions (Addendum)
You were treated with antibiotics today. We will call you if you have an abnormal urine culture and need different antibiotics. Thank you for allowing me to care for you today. Please return to the emergency department if you have new or worsening symptoms. Take your medications as instructed.

## 2019-11-28 NOTE — ED Provider Notes (Signed)
[facility]-EMERGENCY DEPT Provider Note   CSN: [id number] Arrival date & time: 11/28/19  0246     History Chief Complaint  Patient presents with   Exposure to STD    Casey Duncan is a 39 y.o. female.  Patient is a 39 year old female with no contributory past medical history presenting to the emergency department for STD and pregnancy check.  Patient reports that last Sunday or Monday she had sex with a new partner and the condom broke.  Reports that she has had slightly foul-smelling urine since then but denies any vaginal discharge, bleeding, abdominal pain, fever, chills, dysuria.  Reports wanting STD check as well as pregnancy test        Past Medical History:  Diagnosis Date   Abnormal vaginal bleeding    Anemia    Gestational diabetes     Patient Active Problem List   Diagnosis Date Noted   SVD (12/28) 05/27/2019   GDM, class A1 05/27/2019   Normal labor and delivery 05/25/2019    Past Surgical History:  Procedure Laterality Date   BREAST SURGERY     lipo 360     vaginal rejuvenation       OB History    Gravida  4   Para  4   Term  4   Preterm      AB  0   Living  3     SAB      TAB      Ectopic      Multiple  0   Live Births  4        Obstetric Comments  Baby passed @ 16 months in 2006        Family History  Problem Relation Age of Onset   Healthy Mother    Healthy Father     Social History   Tobacco Use   Smoking status: Former Smoker    Packs/day: 2.00   Smokeless tobacco: Never Used  Building services engineer Use: Never used  Substance Use Topics   Alcohol use: No    Comment: Patient denies   Drug use: Not Currently    Types: Marijuana    Comment: does not use anymore     Home Medications Prior to Admission medications   Medication Sig Start Date End Date Taking? Authorizing Provider  acetaminophen (TYLENOL) 325 MG tablet Take 2 tablets (650 mg total) by mouth every 4 (four)  hours as needed (for pain scale < 4). 05/27/19   Roma Schanz, CNM  cephALEXin (KEFLEX) 500 MG capsule Take 1 capsule (500 mg total) by mouth 2 (two) times daily. 11/03/19   Garlon Hatchet, PA-C  metroNIDAZOLE (FLAGYL) 500 MG tablet Take 1 tablet (500 mg total) by mouth 2 (two) times daily. 07/06/19   Merrilee Jansky, MD  norgestimate-ethinyl estradiol ([place] 28) 0.25-35 MG-MCG tablet Take 1 tablet by mouth daily. 07/06/19   LampteyBritta Mccreedy, MD    Allergies    Sulfa antibiotics  Review of Systems   Review of Systems  Constitutional: Negative for chills and fever.  HENT: Negative for congestion.   Cardiovascular: Negative for chest pain.  Gastrointestinal: Negative for abdominal pain, nausea and vomiting.  Genitourinary: Negative for difficulty urinating, dysuria, frequency, genital sores, hematuria, menstrual problem and pelvic pain.  Musculoskeletal: Negative for back pain.  Skin: Negative for rash.    Physical Exam Updated Vital Signs BP (!) 135/110 (BP Location: Left Arm)  Pulse 96    Temp 98.1 F (36.7 C) (Oral)    Resp 20    SpO2 100%   Physical Exam Vitals and nursing note reviewed. Exam conducted with a chaperone present.  Constitutional:      General: She is not in acute distress.    Appearance: Normal appearance. She is not ill-appearing, toxic-appearing or diaphoretic.  HENT:     Head: Normocephalic.  Eyes:     Conjunctiva/sclera: Conjunctivae normal.  Pulmonary:     Effort: Pulmonary effort is normal.  Abdominal:     General: Abdomen is flat.     Palpations: Abdomen is soft.  Genitourinary:    Vagina: Vaginal discharge present. No tenderness.     Cervix: No cervical motion tenderness, discharge, erythema or cervical bleeding.  Skin:    General: Skin is dry.  Neurological:     Mental Status: She is alert.  Psychiatric:        Mood and Affect: Mood normal.     ED Results / Procedures / Treatments   Labs (all labs ordered are listed, but only  abnormal results are displayed) Labs Reviewed  WET PREP, GENITAL - Abnormal; Notable for the following components:      Result Value   WBC, Wet Prep HPF POC FEW (*)    All other components within normal limits  URINALYSIS, ROUTINE W REFLEX MICROSCOPIC - Abnormal; Notable for the following components:   Hgb urine dipstick SMALL (*)    Nitrite POSITIVE (*)    Leukocytes,Ua TRACE (*)    Bacteria, UA MANY (*)    All other components within normal limits  URINE CULTURE  POC URINE PREG, ED  POC URINE PREG, ED  GC/CHLAMYDIA PROBE AMP ([place]) NOT AT [facility]    EKG None  Radiology No results found.  Procedures Procedures (including critical care time)  Medications Ordered in ED Medications  cefTRIAXone (ROCEPHIN) injection 500 mg (has no administration in time range)  azithromycin (ZITHROMAX) powder 1 g (has no administration in time range)    ED Course  I have reviewed the triage vital signs and the nursing notes.  Pertinent labs & imaging results that were available during my care of the patient were reviewed by me and considered in my medical decision making (see chart for details).  Clinical Course as of Nov 28 450  Sat Nov 28, 2019  0425 Patient with foul-smelling urine after unprotected sex with a new partner.  Urinalysis consistent with urinary tract infection.  We will treat her with Rocephin and Zithromax.  We will culture her urine and send off STD testing.   [KM]    Clinical Course User Index [KM] Jeral Pinch   MDM Rules/Calculators/A&P                          Based on review of vitals, medical screening exam, lab work and/or imaging, there does not appear to be an acute, emergent etiology for the patient's symptoms. Counseled pt on good return precautions and encouraged both PCP and ED follow-up as needed.  Prior to discharge, I also discussed incidental imaging findings with patient in detail and advised appropriate, recommended follow-up in  detail.  Clinical Impression: 1. Possible exposure to STD     Disposition: Discharge  Prior to providing a prescription for a controlled substance, I independently reviewed the patient's recent prescription history on the [place] Controlled Substance Reporting System. The patient had no recent  or regular prescriptions and was deemed appropriate for a brief, less than 3 day prescription of narcotic for acute analgesia.  This note was prepared with assistance of Conservation officer, historic buildings. Occasional wrong-word or sound-a-like substitutions may have occurred due to the inherent limitations of voice recognition software.  Final Clinical Impression(s) / ED Diagnoses Final diagnoses:  Possible exposure to STD    Rx / DC Orders ED Discharge Orders    None       Jeral Pinch 11/28/19 [id number]    Zadie Rhine, MD 11/28/19 2306

## 2019-11-28 NOTE — ED Triage Notes (Signed)
Pt requesting a pregnancy test and a full STD check. States that she had sex on Sunday and the condom broke.  Also reports malodorous urine.

## 2019-11-30 LAB — URINE CULTURE: Culture: >=100000 — AB

## 2019-12-01 ENCOUNTER — Telehealth: Payer: Self-pay | Admitting: Emergency Medicine

## 2019-12-01 LAB — GC/CHLAMYDIA PROBE AMP (~~LOC~~) NOT AT ARMC
Chlamydia: NEGATIVE
Comment: NEGATIVE
Comment: NORMAL
Neisseria Gonorrhea: NEGATIVE

## 2019-12-01 NOTE — Telephone Encounter (Signed)
Post ED Visit - Positive Culture Follow-up: Successful Patient Follow-Up  Culture assessed and recommendations reviewed by:  []  , Pharm.D. []  Enzo Bi, Pharm.D., BCPS AQ-ID []  , Pharm.D., BCPS []  Celedonio Miyamoto, Pharm.D., BCPS []  [place], Garvin Fila.D., BCPS, AAHIVP []  , Pharm.D., BCPS, AAHIVP []  Georgina Pillion, PharmD, BCPS []  , PharmD, BCPS []  [place], PharmD, BCPS []  [address], PharmD PharmD  Positive urine culture  [x]  Patient discharged without antimicrobial prescription and treatment is now indicated []  Organism is resistant to prescribed ED discharge antimicrobial []  Patient with positive blood cultures  Changes discussed with ED provider: Estella Husk PA New antibiotic prescription start cephalexin 500mg  po q 6 hours x 5 days  called to [facility] [address]  Contacted patient   Lysle Pearl 12/01/2019, 1:15 PM

## 2019-12-01 NOTE — Progress Notes (Signed)
ED Antimicrobial Stewardship Positive Culture Follow Up   Casey Duncan is an 38 y.o. female who presented to [facility] on 11/28/2019 with a chief complaint of  Chief Complaint  Patient presents with  . Exposure to STD    Recent Results (from the past 720 hour(s))  Wet prep, genital     Status: None   Collection Time: 11/03/19  2:00 AM   Specimen: PATH Cytology Cervicovaginal Ancillary Only; Genital  Result Value Ref Range Status   Yeast Wet Prep HPF POC NONE SEEN NONE SEEN Final   Trich, Wet Prep NONE SEEN NONE SEEN Final    Comment: Swab received with less than 0.5 mL of saline, saline added to specimen, interpret results with caution.   Clue Cells Wet Prep HPF POC NONE SEEN NONE SEEN Final   WBC, Wet Prep HPF POC NONE SEEN NONE SEEN Final   Sperm NONE SEEN  Final    Comment: Performed at [facility], [address], [place], [place] [address]  Urine culture     Status: Abnormal   Collection Time: 11/28/19  3:21 AM   Specimen: Urine, Random  Result Value Ref Range Status   Specimen Description   Final    URINE, RANDOM Performed at [facility], [address], [place], [place] [address]    Special Requests   Final    NONE Performed at [facility], [address] [place], [place] [address]    Culture >=100,000 COLONIES/mL ESCHERICHIA COLI (A)  Final   Report Status 11/30/2019 FINAL  Final   Organism ID, Bacteria ESCHERICHIA COLI (A)  Final      Susceptibility   Escherichia coli - MIC*    AMPICILLIN >=32 RESISTANT Resistant     CEFAZOLIN <=4 SENSITIVE Sensitive     CEFTRIAXONE <=0.25 SENSITIVE Sensitive     CIPROFLOXACIN <=0.25 SENSITIVE Sensitive     GENTAMICIN <=1 SENSITIVE Sensitive     IMIPENEM <=0.25 SENSITIVE Sensitive     NITROFURANTOIN <=16 SENSITIVE Sensitive     TRIMETH/SULFA <=20 SENSITIVE Sensitive     AMPICILLIN/SULBACTAM 8 SENSITIVE Sensitive     PIP/TAZO <=4 SENSITIVE Sensitive     * >=100,000  COLONIES/mL ESCHERICHIA COLI  Wet prep, genital     Status: Abnormal   Collection Time: 11/28/19  4:21 AM   Specimen: Urine, Clean Catch  Result Value Ref Range Status   Yeast Wet Prep HPF POC NONE SEEN NONE SEEN Final   Trich, Wet Prep NONE SEEN NONE SEEN Final   Clue Cells Wet Prep HPF POC NONE SEEN NONE SEEN Final   WBC, Wet Prep HPF POC FEW (A) NONE SEEN Final   Sperm NONE SEEN  Final    Comment: Performed at [facility], [address], [place], [place] [address]    []  Treated with ---, organism resistant to prescribed antimicrobial [x]  Patient discharged originally without antimicrobial agent and treatment is now indicated  New antibiotic prescription:  - Start cephalexin 500 mg PO q6h x 5 days   ED Provider: , PA-C    , PharmD, BCPS 12/01/2019 11:10 AM

## 2020-05-17 ENCOUNTER — Other Ambulatory Visit: Payer: Self-pay

## 2020-05-17 ENCOUNTER — Ambulatory Visit (HOSPITAL_COMMUNITY)
Admission: EM | Admit: 2020-05-17 | Discharge: 2020-05-17 | Disposition: A | Discharge status: Home / Self Care | Payer: Medicaid Other | Attending: Emergency Medicine | Admitting: Emergency Medicine

## 2020-05-17 ENCOUNTER — Encounter (HOSPITAL_COMMUNITY): Payer: Self-pay

## 2020-05-17 DIAGNOSIS — N898 Other specified noninflammatory disorders of vagina: Secondary | ICD-10-CM | POA: Insufficient documentation

## 2020-05-17 DIAGNOSIS — Z113 Encounter for screening for infections with a predominantly sexual mode of transmission: Secondary | ICD-10-CM | POA: Insufficient documentation

## 2020-05-17 DIAGNOSIS — L02214 Cutaneous abscess of groin: Secondary | ICD-10-CM | POA: Insufficient documentation

## 2020-05-17 LAB — POCT URINALYSIS DIPSTICK, ED / UC
Bilirubin Urine: NEGATIVE
Glucose, UA: NEGATIVE mg/dL
Leukocytes,Ua: NEGATIVE
Nitrite: NEGATIVE
Protein, ur: NEGATIVE mg/dL
Specific Gravity, Urine: 1.025 (ref 1.005–1.030)
Urobilinogen, UA: 1 mg/dL (ref 0.0–1.0)
pH: 5.5 (ref 5.0–8.0)

## 2020-05-17 LAB — POC URINE PREG, ED: Preg Test, Ur: NEGATIVE

## 2020-05-17 MED ORDER — METRONIDAZOLE 500 MG PO TABS
500.0000 mg | ORAL_TABLET | Freq: Two times a day (BID) | ORAL | 0 refills | Status: AC
Start: 2020-05-17 — End: 2020-05-24

## 2020-05-17 MED ORDER — DOXYCYCLINE HYCLATE 100 MG PO CAPS
100.0000 mg | ORAL_CAPSULE | Freq: Two times a day (BID) | ORAL | 0 refills | Status: AC
Start: 2020-05-17 — End: 2020-05-24

## 2020-05-17 MED ORDER — IBUPROFEN 600 MG PO TABS
600.0000 mg | ORAL_TABLET | Freq: Four times a day (QID) | ORAL | 0 refills | Status: DC | PRN
Start: 2020-05-17 — End: 2021-09-15

## 2020-05-17 NOTE — ED Notes (Signed)
DR Teresa Pelton aware blood work not drawn.  Pt did not want to be stuck again . Pt had blood drawn earlier in the day and did not want more sticks

## 2020-05-17 NOTE — ED Provider Notes (Signed)
HPI  SUBJECTIVE:  Casey Duncan is a 39 y.o. female who presents with a painful mass of increasing size between her left vulva and thigh for the past 2 days.  She states that the pain is constant.  She denies body aches, fevers, drainage from this area.  States that she shaved 3 or 4 days ago and had intercourse 2 days ago.  She reports noticing a fishy vaginal odor during intercourse.  She denies vaginal odor at any other time.  No urinary complaints, vaginal bleeding, discharge, genital rash, itching, vulvar swelling.  She states that she is in a monogamous relationship with a female partner, who is asymptomatic, however STDs are a concern today and would like to be checked.  She has tried a heating pad and warm compresses without improvement in her symptoms.  Symptoms worse with walking, palpation.  She has a history of chlamydia, trichomonas, BV, yeast.  No history of MRSA, diabetes, gonorrhea, HIV, HSV, syphilis, Bartholin's abscess.  LMP: 11/13.  Denies possibility being pregnant.  PMD: None.   Past Medical History:  Diagnosis Date  . Abnormal vaginal bleeding   . Anemia   . Gestational diabetes     Past Surgical History:  Procedure Laterality Date  . BREAST SURGERY    . lipo 360    . vaginal rejuvenation      Family History  Problem Relation Age of Onset  . Healthy Mother   . Healthy Father     Social History   Tobacco Use  . Smoking status: Former Smoker    Packs/day: 2.00  . Smokeless tobacco: Never Used  Vaping Use  . Vaping Use: Never used  Substance Use Topics  . Alcohol use: No    Comment: Patient denies  . Drug use: Not Currently    Types: Marijuana    Comment: does not use anymore     No current facility-administered medications for this encounter.  Current Outpatient Medications:  .  doxycycline (VIBRAMYCIN) 100 MG capsule, Take 1 capsule (100 mg total) by mouth 2 (two) times daily for 7 days., Disp: 14 capsule, Rfl: 0 .  ibuprofen (ADVIL) 600 MG tablet,  Take 1 tablet (600 mg total) by mouth every 6 (six) hours as needed., Disp: 30 tablet, Rfl: 0 .  metroNIDAZOLE (FLAGYL) 500 MG tablet, Take 1 tablet (500 mg total) by mouth 2 (two) times daily for 7 days., Disp: 14 tablet, Rfl: 0 .  norgestimate-ethinyl estradiol (SPRINTEC 28) 0.25-35 MG-MCG tablet, Take 1 tablet by mouth daily., Disp: 1 Package, Rfl: 11  Allergies  Allergen Reactions  . Sulfa Antibiotics Itching and Swelling    Throat and tongue itching     ROS  As noted in HPI.   Physical Exam  BP 114/78 (BP Location: Right Arm)   Pulse (!) 107   Temp 97.9 F (36.6 C) (Oral)   LMP 04/09/2020   SpO2 100%   Constitutional: Well developed, well nourished, no acute distress Eyes:  EOMI, conjunctiva normal bilaterally HENT: Normocephalic, atraumatic,mucus membranes moist Respiratory: Normal inspiratory effort Cardiovascular: Normal rate GI: nondistended soft, nontender. No suprapubic tenderness  back: No CVA tenderness GU: 1.5 x 0.5 cm tender area of erythema, induration between left external labia and thigh.  No central fluctuance.  External genitalia otherwise normal.  Normal vaginal mucosa.  Normal os.  Copious thin white vaginal discharge.   Uterus smooth, NT. No  CMT. No adnexal tenderness. No adnexal masses.  Chaperone present during exam skin: No rash, skin  intact Musculoskeletal: no deformities Neurologic: Alert & oriented x 3, no focal neuro deficits Psychiatric: Speech and behavior appropriate   ED Course   Medications - No data to display  Orders Placed This Encounter  Procedures  . POC urine pregnancy    Standing Status:   Standing    Number of Occurrences:   1  . POC Urinalysis dipstick    Standing Status:   Standing    Number of Occurrences:   1    Results for orders placed or performed during the hospital encounter of 05/17/20 (from the past 24 hour(s))  POC Urinalysis dipstick     Status: Abnormal   Collection Time: 05/17/20  8:10 PM  Result Value  Ref Range   Glucose, UA NEGATIVE NEGATIVE mg/dL   Bilirubin Urine NEGATIVE NEGATIVE   Ketones, ur TRACE (A) NEGATIVE mg/dL   Specific Gravity, Urine 1.025 1.005 - 1.030   Hgb urine dipstick SMALL (A) NEGATIVE   pH 5.5 5.0 - 8.0   Protein, ur NEGATIVE NEGATIVE mg/dL   Urobilinogen, UA 1.0 0.0 - 1.0 mg/dL   Nitrite NEGATIVE NEGATIVE   Leukocytes,Ua NEGATIVE NEGATIVE  POC urine pregnancy     Status: None   Collection Time: 05/17/20  8:17 PM  Result Value Ref Range   Preg Test, Ur NEGATIVE NEGATIVE   No results found.  ED Clinical Impression  1. Abscess of left groin   2. Vaginal discharge   3. Screening examination for STD (sexually transmitted disease)      ED Assessment/Plan  UA negative for UTI.  She is not pregnant.  One.  Cellulitis versus abscess.  There does not appear to be anything I&D today.  It is not a Bartholin's gland abscess.  Will send home with warm compresses, doxycycline 100 mg p.o. twice daily, which will also cover possible chlamydial infection, Tylenol/ibuprofen three or four times a day.  [facility].  Two.  Vaginal discharge/STD screening.  Suspect BV.  Flagyl for 1 week.  Gonorrhea, chlamydia, trichomonas, BV, yeast sent.  Patient declined HIV, syphilis testing.  Discussed labs, MDM, treatment plan and followup with patient. Pt agrees with plan.   Meds ordered this encounter  Medications  . ibuprofen (ADVIL) 600 MG tablet    Sig: Take 1 tablet (600 mg total) by mouth every 6 (six) hours as needed.    Dispense:  30 tablet    Refill:  0  . doxycycline (VIBRAMYCIN) 100 MG capsule    Sig: Take 1 capsule (100 mg total) by mouth 2 (two) times daily for 7 days.    Dispense:  14 capsule    Refill:  0  . metroNIDAZOLE (FLAGYL) 500 MG tablet    Sig: Take 1 tablet (500 mg total) by mouth 2 (two) times daily for 7 days.    Dispense:  14 tablet    Refill:  0    *This clinic note was created using Scientist, clinical (histocompatibility and immunogenetics).  Therefore, there may be occasional mistakes despite careful proofreading.  ?    Domenick Gong, MD 05/18/20 [phone]

## 2020-05-17 NOTE — Discharge Instructions (Addendum)
Doxycycline will help take care of the skin infection.  Warm compresses as often as you can.  Take 1 gram of tylenol with the motrin up to 3-4 times a day as needed for pain. This is an effective combination for pain. Return to the ER if you get worse, have a fever >100.4, or for any concerns.   Take the Flagyl.  I suspect that you have BV.  Give [place] a working phone number so that we can contact you if needed. Refrain from sexual contact until you know your results and your partner(s) are treated if necessary.  Below is a list of primary care practices who are taking new patients for you to follow-up with.  [facility] internal medicine clinic Ground Floor - [facility], [address] [place], [place], [place] [address] [phone]  [facility] Primary Care at [facility] [address] [place], [place] [address] [phone]  [facility] [address] [place], [place] [address] [phone]  [facility] [phone] [address] [place] [address]  [facility]: [address] [place] [address]  [phone]  [facility]: [address] [place] [address]   [phone]  [facility]: [address] [place] [address]  [phone]   primary care : [address] [place] [address] [phone]  [facility] Primary Care: [address] [place] [address] [phone]  [facility]: [address] [place] [address] [phone]  Dr. Oneal Grout [address] [facility] [place] [address]  [phone]  Dr. Jackie Plum, [facility]. [address] [place], [place] [address]  [phone]    Go to www.goodrx.com to look up  your medications. This will give you a list of where you can find your prescriptions at the most affordable prices. Or ask the pharmacist what the cash price is, or if they have any other discount programs available to help make your medication more affordable. This can be less expensive than what you would pay with insurance.

## 2020-05-17 NOTE — ED Triage Notes (Signed)
Pt presents with possible vaginal abscess on left side along crease line that she noticed yesterday.

## 2020-05-18 ENCOUNTER — Telehealth (HOSPITAL_COMMUNITY): Payer: Self-pay | Admitting: Emergency Medicine

## 2020-05-18 LAB — CERVICOVAGINAL ANCILLARY ONLY
Bacterial Vaginitis (gardnerella): POSITIVE — AB
Candida Glabrata: NEGATIVE
Candida Vaginitis: NEGATIVE
Chlamydia: NEGATIVE
Comment: NEGATIVE
Comment: NEGATIVE
Comment: NEGATIVE
Comment: NEGATIVE
Comment: NEGATIVE
Comment: NORMAL
Neisseria Gonorrhea: NEGATIVE
Trichomonas: NEGATIVE

## 2020-05-18 NOTE — Telephone Encounter (Signed)
Opened due to patient calling to have gel version of metronidazole sent.  After discussion, patient decided not to change.

## 2020-07-11 ENCOUNTER — Encounter (HOSPITAL_BASED_OUTPATIENT_CLINIC_OR_DEPARTMENT_OTHER): Payer: Self-pay

## 2020-07-11 ENCOUNTER — Other Ambulatory Visit: Payer: Self-pay

## 2020-07-11 ENCOUNTER — Emergency Department (HOSPITAL_BASED_OUTPATIENT_CLINIC_OR_DEPARTMENT_OTHER)
Admission: EM | Admit: 2020-07-11 | Discharge: 2020-07-11 | Disposition: A | Discharge status: Home / Self Care | Payer: Medicaid Other | Attending: Emergency Medicine | Admitting: Emergency Medicine

## 2020-07-11 DIAGNOSIS — R1031 Right lower quadrant pain: Secondary | ICD-10-CM

## 2020-07-11 DIAGNOSIS — N390 Urinary tract infection, site not specified: Secondary | ICD-10-CM | POA: Insufficient documentation

## 2020-07-11 DIAGNOSIS — Z87891 Personal history of nicotine dependence: Secondary | ICD-10-CM | POA: Diagnosis not present

## 2020-07-11 LAB — CBC WITH DIFFERENTIAL/PLATELET
Abs Immature Granulocytes: 0.04 10*3/uL (ref 0.00–0.07)
Basophils Absolute: 0.1 10*3/uL (ref 0.0–0.1)
Basophils Relative: 0 %
Eosinophils Absolute: 0.1 10*3/uL (ref 0.0–0.5)
Eosinophils Relative: 1 %
HCT: 41.2 % (ref 36.0–46.0)
Hemoglobin: 13.5 g/dL (ref 12.0–15.0)
Immature Granulocytes: 0 %
Lymphocytes Relative: 19 %
Lymphs Abs: 2.7 10*3/uL (ref 0.7–4.0)
MCH: 29 pg (ref 26.0–34.0)
MCHC: 32.8 g/dL (ref 30.0–36.0)
MCV: 88.6 fL (ref 80.0–100.0)
Monocytes Absolute: 0.4 10*3/uL (ref 0.1–1.0)
Monocytes Relative: 3 %
Neutro Abs: 11.2 10*3/uL — ABNORMAL HIGH (ref 1.7–7.7)
Neutrophils Relative %: 77 %
Platelets: 482 10*3/uL — ABNORMAL HIGH (ref 150–400)
RBC: 4.65 MIL/uL (ref 3.87–5.11)
RDW: 14.6 % (ref 11.5–15.5)
WBC: 14.5 10*3/uL — ABNORMAL HIGH (ref 4.0–10.5)
nRBC: 0 % (ref 0.0–0.2)

## 2020-07-11 LAB — URINALYSIS, MICROSCOPIC (REFLEX)

## 2020-07-11 LAB — PREGNANCY, URINE: Preg Test, Ur: NEGATIVE

## 2020-07-11 LAB — WET PREP, GENITAL
Clue Cells Wet Prep HPF POC: NONE SEEN
Sperm: NONE SEEN
Trich, Wet Prep: NONE SEEN
Yeast Wet Prep HPF POC: NONE SEEN

## 2020-07-11 LAB — URINALYSIS, ROUTINE W REFLEX MICROSCOPIC
Bilirubin Urine: NEGATIVE
Glucose, UA: NEGATIVE mg/dL
Ketones, ur: NEGATIVE mg/dL
Leukocytes,Ua: NEGATIVE
Nitrite: NEGATIVE
Protein, ur: NEGATIVE mg/dL
Specific Gravity, Urine: 1.03 (ref 1.005–1.030)
pH: 6 (ref 5.0–8.0)

## 2020-07-11 MED ORDER — ACETAMINOPHEN 325 MG PO TABS
650.0000 mg | ORAL_TABLET | Freq: Once | ORAL | Status: DC
  Filled 2020-07-11: qty 2

## 2020-07-11 MED ORDER — AZITHROMYCIN 250 MG PO TABS
1000.0000 mg | ORAL_TABLET | Freq: Once | ORAL | Status: AC
  Administered 2020-07-11: 1000 mg via ORAL
  Filled 2020-07-11: qty 4

## 2020-07-11 MED ORDER — LIDOCAINE HCL (PF) 1 % IJ SOLN
1.0000 mL | Freq: Once | INTRAMUSCULAR | Status: AC
  Administered 2020-07-11: 1 mL
  Filled 2020-07-11: qty 5

## 2020-07-11 MED ORDER — CEFTRIAXONE SODIUM 500 MG IJ SOLR
500.0000 mg | Freq: Once | INTRAMUSCULAR | Status: AC
  Administered 2020-07-11: 500 mg via INTRAMUSCULAR
  Filled 2020-07-11: qty 500

## 2020-07-11 MED ORDER — CEPHALEXIN 500 MG PO CAPS
500.0000 mg | ORAL_CAPSULE | Freq: Three times a day (TID) | ORAL | 0 refills | Status: DC
Start: 2020-07-11 — End: 2021-09-15

## 2020-07-11 NOTE — ED Notes (Addendum)
Patient is requesting a rapid HIV test. MD reports that is not able to be ordered. RN explained this to patient and it may still be up to 48 hours before the test results. Patient requested to speak to Charge RN. Charge RN made aware.

## 2020-07-11 NOTE — ED Provider Notes (Signed)
[facility] EMERGENCY DEPARTMENT Provider Note   CSN: [id number] Arrival date & time: 07/11/20  1208     History Chief Complaint  Patient presents with  . Abdominal Pain    Casey Duncan is a 40 y.o. female.  Patient presents to the ER concern of abdominal pain.  She has had this pain for 3 days now describes aching in the lower to right abdominal region.  Nothing seems to make it better.  Denies any vaginal bleeding or vaginal discharge.  She states that she had sexual intercourse with an individual without condom protection.  She states that she had contracted chlamydia from this individual years ago and she is concerned that this may have recurred again.        Past Medical History:  Diagnosis Date  . Abnormal vaginal bleeding   . Anemia   . Gestational diabetes     Patient Active Problem List   Diagnosis Date Noted  . SVD (12/28) 05/27/2019  . GDM, class A1 05/27/2019  . Normal labor and delivery 05/25/2019    Past Surgical History:  Procedure Laterality Date  . BREAST SURGERY    . lipo 360    . vaginal rejuvenation       OB History    Gravida  4   Para  4   Term  4   Preterm      AB  0   Living  3     SAB      IAB      Ectopic      Multiple  0   Live Births  4        Obstetric Comments  Baby passed @ 16 months in 2006        Family History  Problem Relation Age of Onset  . Healthy Mother   . Healthy Father     Social History   Tobacco Use  . Smoking status: Former Smoker    Packs/day: 2.00  . Smokeless tobacco: Never Used  Vaping Use  . Vaping Use: Never used  Substance Use Topics  . Alcohol use: No  . Drug use: Not Currently    Home Medications Prior to Admission medications   Medication Sig Start Date End Date Taking? Authorizing Provider  cephALEXin (KEFLEX) 500 MG capsule Take 1 capsule (500 mg total) by mouth 3 (three) times daily. 07/11/20  Yes Cheryll Cockayne, MD  ibuprofen (ADVIL) 600 MG tablet  Take 1 tablet (600 mg total) by mouth every 6 (six) hours as needed. 05/17/20   Domenick Gong, MD  norgestimate-ethinyl estradiol (SPRINTEC 28) 0.25-35 MG-MCG tablet Take 1 tablet by mouth daily. 07/06/19   Lamptey, Britta Mccreedy, MD    Allergies    Sulfa antibiotics  Review of Systems   Review of Systems  Constitutional: Negative for fever.  HENT: Negative for ear pain.   Eyes: Negative for pain.  Respiratory: Negative for cough.   Cardiovascular: Negative for chest pain.  Gastrointestinal: Positive for abdominal pain.  Genitourinary: Negative for flank pain.  Musculoskeletal: Negative for back pain.  Skin: Negative for rash.  Neurological: Negative for headaches.    Physical Exam Updated Vital Signs BP 121/88 (BP Location: Right Arm)   Pulse 83   Temp (!) 97.4 F (36.3 C) (Oral)   Resp 18   Ht 5\' 9"  (1.753 m)   Wt 113.4 kg   LMP 06/28/2020   SpO2 100%   BMI 36.92 kg/m   Physical Exam  Constitutional:      General: She is not in acute distress.    Appearance: Normal appearance.  HENT:     Head: Normocephalic.     Nose: Nose normal.  Eyes:     Extraocular Movements: Extraocular movements intact.  Cardiovascular:     Rate and Rhythm: Normal rate.  Pulmonary:     Effort: Pulmonary effort is normal.  Abdominal:     Tenderness: There is abdominal tenderness in the right lower quadrant.  Musculoskeletal:        General: Normal range of motion.     Cervical back: Normal range of motion.  Neurological:     General: No focal deficit present.     Mental Status: She is alert. Mental status is at baseline.     ED Results / Procedures / Treatments   Labs (all labs ordered are listed, but only abnormal results are displayed) Labs Reviewed  CBC WITH DIFFERENTIAL/PLATELET - Abnormal; Notable for the following components:      Result Value   WBC 14.5 (*)    Platelets 482 (*)    Neutro Abs 11.2 (*)    All other components within normal limits  URINALYSIS, ROUTINE W  REFLEX MICROSCOPIC - Abnormal; Notable for the following components:   APPearance HAZY (*)    Hgb urine dipstick MODERATE (*)    All other components within normal limits  URINALYSIS, MICROSCOPIC (REFLEX) - Abnormal; Notable for the following components:   Bacteria, UA MANY (*)    All other components within normal limits  WET PREP, GENITAL  URINE CULTURE  PREGNANCY, URINE  GC/CHLAMYDIA PROBE AMP ([place]) NOT AT [facility]    EKG None  Radiology No results found.  Procedures Procedures   Medications Ordered in ED Medications  acetaminophen (TYLENOL) tablet 650 mg (650 mg Oral Patient Refused/Not Given 07/11/20 1247)  azithromycin (ZITHROMAX) tablet 1,000 mg (has no administration in time range)  cefTRIAXone (ROCEPHIN) injection 500 mg (has no administration in time range)  lidocaine (PF) (XYLOCAINE) 1 % injection 1 mL (has no administration in time range)    ED Course  I have reviewed the triage vital signs and the nursing notes.  Pertinent labs & imaging results that were available during my care of the patient were reviewed by me and considered in my medical decision making (see chart for details).    MDM Rules/Calculators/A&P                          Urinalysis shows many bacteria.  Labs sent white count of 14 chemistry remarkable.  Pelvic exam performed with nursing chaperone present, no cervical motion tenderness present.  Tenderness in the right lower quadrant reproduced on palpation.  Patient at this point states that she does not want to stay any longer she has to pick up her child and will be leaving AGAINST MEDICAL ADVICE.  Risks of missed tubo-ovarian abscess versus acute appendicitis discussed with patient.  She declines to stay because she needs to pick up her child.  Advised her to return anytime she change her mind.  Patient living AGAINST MEDICAL ADVICE.   Final Clinical Impression(s) / ED Diagnoses Final diagnoses:  Right lower quadrant abdominal pain   Urinary tract infection without hematuria, site unspecified    Rx / DC Orders ED Discharge Orders         Ordered    cephALEXin (KEFLEX) 500 MG capsule  3 times daily  07/11/20 1417           Cheryll Cockayne, MD 07/11/20 1418

## 2020-07-11 NOTE — ED Notes (Signed)
Attempt to obtain labwork unsuccessful x2, another RN to attempt with u/s guidance.

## 2020-07-11 NOTE — ED Notes (Addendum)
Need for redraw of lab tubes discussed with patient and Dr Audley Hose, pt opts for no repeat redraw, plan to administer medication then pt for discharge. Pt declines CT scan

## 2020-07-11 NOTE — ED Triage Notes (Signed)
Pt c/o "all over" abd pain x 3 days-denies n/v/d-states she wants STD testing-NAD-steady gait

## 2020-07-11 NOTE — Discharge Instructions (Addendum)
Return at any time if you change your mind.    Follow-up with your doctor tomorrow.  You run the risk of missed diagnoses for acute appendicitis, abscess, infection, or other possible serious illness which may be life-threatening.  Do not have sexual intercourse with any partners until you have been cleared by your doctor.

## 2020-07-12 LAB — GC/CHLAMYDIA PROBE AMP (~~LOC~~) NOT AT ARMC
Chlamydia: NEGATIVE
Comment: NEGATIVE
Comment: NORMAL
Neisseria Gonorrhea: NEGATIVE

## 2020-07-13 LAB — URINE CULTURE: Culture: >=100000 — AB

## 2020-12-30 ENCOUNTER — Other Ambulatory Visit: Payer: Self-pay

## 2020-12-30 ENCOUNTER — Ambulatory Visit (HOSPITAL_COMMUNITY)
Admission: EM | Admit: 2020-12-30 | Discharge: 2020-12-30 | Disposition: A | Discharge status: Home / Self Care | Payer: Medicaid Other | Attending: Emergency Medicine | Admitting: Emergency Medicine

## 2020-12-30 ENCOUNTER — Encounter (HOSPITAL_COMMUNITY): Payer: Self-pay | Admitting: *Deleted

## 2020-12-30 DIAGNOSIS — Z113 Encounter for screening for infections with a predominantly sexual mode of transmission: Secondary | ICD-10-CM | POA: Diagnosis present

## 2020-12-30 NOTE — ED Provider Notes (Signed)
[facility]    CSN: [id number] Arrival date & time: 12/30/20  1749      History   Chief Complaint No chief complaint on file.   HPI Casey Duncan is a 40 y.o. female.   Patient here for STD testing.  Reports having a new partner over the weekend and states that the condom was removed.  Denies any known STI positive partners.  Denies any symptoms.  Denies any trauma, injury, or other precipitating event.  Denies any specific alleviating or aggravating factors.  Denies any fevers, chest pain, shortness of breath, N/V/D, numbness, tingling, weakness, abdominal pain, or headaches.    The history is provided by the patient.   Past Medical History:  Diagnosis Date   Abnormal vaginal bleeding    Anemia    Gestational diabetes     Patient Active Problem List   Diagnosis Date Noted   SVD (12/28) 05/27/2019   GDM, class A1 05/27/2019   Normal labor and delivery 05/25/2019    Past Surgical History:  Procedure Laterality Date   BREAST SURGERY     lipo 360     vaginal rejuvenation      OB History     Gravida  4   Para  4   Term  4   Preterm      AB  0   Living  3      SAB      IAB      Ectopic      Multiple  0   Live Births  4        Obstetric Comments  Baby passed @ 16 months in 2006          Home Medications    Prior to Admission medications   Medication Sig Start Date End Date Taking? Authorizing Provider  cephALEXin (KEFLEX) 500 MG capsule Take 1 capsule (500 mg total) by mouth 3 (three) times daily. 07/11/20   Cheryll Cockayne, MD  ibuprofen (ADVIL) 600 MG tablet Take 1 tablet (600 mg total) by mouth every 6 (six) hours as needed. 05/17/20   Domenick Gong, MD  norgestimate-ethinyl estradiol (SPRINTEC 28) 0.25-35 MG-MCG tablet Take 1 tablet by mouth daily. 07/06/19   Lamptey, Britta Mccreedy, MD    Family History Family History  Problem Relation Age of Onset   Healthy Mother    Healthy Father     Social History Social History    Tobacco Use   Smoking status: Former    Packs/day: 2.00    Types: Cigarettes   Smokeless tobacco: Never  Vaping Use   Vaping Use: Never used  Substance Use Topics   Alcohol use: No   Drug use: Not Currently     Allergies   Sulfa antibiotics   Review of Systems Review of Systems  All other systems reviewed and are negative.   Physical Exam Triage Vital Signs ED Triage Vitals  Enc Vitals Group     BP 12/30/20 1807 114/76     Pulse Rate 12/30/20 1807 84     Resp 12/30/20 1807 18     Temp 12/30/20 1807 98 F (36.7 C)     Temp src --      SpO2 --      Weight --      Height --      Head Circumference --      Peak Flow --      Pain Score 12/30/20 1853 0  Pain Loc --      Pain Edu? --      Excl. in GC? --    No data found.  Updated Vital Signs BP 114/76   Pulse 84   Temp 98 F (36.7 C)   Resp 18   LMP 12/20/2020   Visual Acuity Right Eye Distance:   Left Eye Distance:   Bilateral Distance:    Right Eye Near:   Left Eye Near:    Bilateral Near:     Physical Exam Vitals and nursing note reviewed.  Constitutional:      General: She is not in acute distress.    Appearance: Normal appearance. She is not ill-appearing, toxic-appearing or diaphoretic.  HENT:     Head: Normocephalic and atraumatic.  Eyes:     Conjunctiva/sclera: Conjunctivae normal.  Cardiovascular:     Rate and Rhythm: Normal rate.     Pulses: Normal pulses.  Pulmonary:     Effort: Pulmonary effort is normal.  Abdominal:     General: Abdomen is flat.  Genitourinary:    Comments: declines Musculoskeletal:        General: Normal range of motion.     Cervical back: Normal range of motion.  Skin:    General: Skin is warm and dry.  Neurological:     General: No focal deficit present.     Mental Status: She is alert and oriented to person, place, and time.  Psychiatric:        Mood and Affect: Mood normal.     UC Treatments / Results  Labs (all labs ordered are listed,  but only abnormal results are displayed) Labs Reviewed  CERVICOVAGINAL ANCILLARY ONLY    EKG   Radiology No results found.  Procedures Procedures (including critical care time)  Medications Ordered in UC Medications - No data to display  Initial Impression / Assessment and Plan / UC Course  I have reviewed the triage vital signs and the nursing notes.  Pertinent labs & imaging results that were available during my care of the patient were reviewed by me and considered in my medical decision making (see chart for details).    Assessment negative for red flags or concerns.  Self swab obtained and will treat based on results.  Discussed safe sex practices including condom or other barrier method use.  Follow up as needed.   Final Clinical Impressions(s) / UC Diagnoses   Final diagnoses:  Screen for STD (sexually transmitted disease)     Discharge Instructions      We will contact you if the results from your lab work are positive and require additional treatment.    Do not have sex while taking undergoing treatment for STI.  Make sure that all of your partners get tested and treated.   Use a condom or other barrier method for all sexual encounters.    Return or go to the Emergency Department if symptoms worsen or do not improve in the next few days.      ED Prescriptions   None    PDMP not reviewed this encounter.   Ivette Loyal, NP 12/30/20 1907

## 2020-12-30 NOTE — ED Triage Notes (Signed)
Pt called from front Lobby no answer

## 2020-12-30 NOTE — Discharge Instructions (Addendum)
We will contact you if the results from your lab work are positive and require additional treatment.    Do not have sex while taking undergoing treatment for STI.  Make sure that all of your partners get tested and treated.   Use a condom or other barrier method for all sexual encounters.    Return or go to the Emergency Department if symptoms worsen or do not improve in the next few days.  

## 2021-01-02 ENCOUNTER — Telehealth (HOSPITAL_COMMUNITY): Payer: Self-pay | Admitting: Emergency Medicine

## 2021-01-02 LAB — CERVICOVAGINAL ANCILLARY ONLY
Bacterial Vaginitis (gardnerella): POSITIVE — AB
Candida Glabrata: NEGATIVE
Candida Vaginitis: NEGATIVE
Chlamydia: NEGATIVE
Comment: NEGATIVE
Comment: NEGATIVE
Comment: NEGATIVE
Comment: NEGATIVE
Comment: NEGATIVE
Comment: NORMAL
Neisseria Gonorrhea: NEGATIVE
Trichomonas: NEGATIVE

## 2021-01-02 MED ORDER — METRONIDAZOLE 500 MG PO TABS: 500.0000 mg | ORAL_TABLET | Freq: Two times a day (BID) | ORAL | 0 refills | Status: DC

## 2021-07-03 ENCOUNTER — Ambulatory Visit: Payer: Medicaid Other | Admitting: Podiatry

## 2021-09-15 ENCOUNTER — Encounter (HOSPITAL_COMMUNITY): Payer: Self-pay | Admitting: Emergency Medicine

## 2021-09-15 ENCOUNTER — Ambulatory Visit (HOSPITAL_COMMUNITY)
Admission: EM | Admit: 2021-09-15 | Discharge: 2021-09-15 | Disposition: A | Discharge status: Home / Self Care | Payer: Medicaid Other | Attending: Emergency Medicine | Admitting: Emergency Medicine

## 2021-09-15 DIAGNOSIS — R300 Dysuria: Secondary | ICD-10-CM | POA: Diagnosis not present

## 2021-09-15 DIAGNOSIS — N898 Other specified noninflammatory disorders of vagina: Secondary | ICD-10-CM | POA: Insufficient documentation

## 2021-09-15 LAB — POCT URINALYSIS DIPSTICK, ED / UC
Bilirubin Urine: NEGATIVE
Glucose, UA: NEGATIVE mg/dL
Ketones, ur: NEGATIVE mg/dL
Nitrite: NEGATIVE
Protein, ur: NEGATIVE mg/dL
Specific Gravity, Urine: 1.025 (ref 1.005–1.030)
Urobilinogen, UA: 0.2 mg/dL (ref 0.0–1.0)
pH: 5.5 (ref 5.0–8.0)

## 2021-09-15 LAB — POC URINE PREG, ED: Preg Test, Ur: NEGATIVE

## 2021-09-15 NOTE — Discharge Instructions (Addendum)
Your urine test today does not show a UTI.  ? ?You will get a call if tests are positive, you will not get a call if tests are negative but you can check results in MyChart if you have a MyChart account.   ?

## 2021-09-15 NOTE — ED Triage Notes (Signed)
Pt reports that she goes to [facility] for her PCP, was told over month ago that had UTI but never gave her medications for it. Then went to another clinic affiliated with [facility] and was told doesn't have UTI. Pt reports odor with urination and would like testing for that and STDs.  ?

## 2021-09-15 NOTE — ED Provider Notes (Signed)
?[facility] ? ? ? ?CSN: [id number] ?Arrival date & time: 09/15/21  5397 ? ? ?  ? ?History   ?Chief Complaint ?Chief Complaint  ?Patient presents with  ? Exposure to STD  ? ? ?HPI ?Casey Duncan is a 41 y.o. female.  Patient reports intermittent dysuria/burning sensation when she urinates.  Patient reports her urine smells bad sometimes.  Patient reports she was told a month ago she had a UTI but never had any medication for it.  Patient reports her last period was 2 months ago but she takes birth control pills without a break to prevent having periods although she is spotting at this time and is expecting her period to start. ? ?She also requests STI testing as maybe a vaginal infection as a source of her urine culture. ? ? ?Exposure to STD ?Pertinent negatives include no abdominal pain.  ? ?Past Medical History:  ?Diagnosis Date  ? Abnormal vaginal bleeding   ? Anemia   ? Gestational diabetes   ? ? ?Patient Active Problem List  ? Diagnosis Date Noted  ? SVD (12/28) 05/27/2019  ? GDM, class A1 05/27/2019  ? Normal labor and delivery 05/25/2019  ? ? ?Past Surgical History:  ?Procedure Laterality Date  ? BREAST SURGERY    ? lipo 360    ? vaginal rejuvenation    ? ? ?OB History   ? ? Gravida  ?4  ? Para  ?4  ? Term  ?4  ? Preterm  ?   ? AB  ?0  ? Living  ?3  ?  ? ? SAB  ?   ? IAB  ?   ? Ectopic  ?   ? Multiple  ?0  ? Live Births  ?4  ?   ?  ? Obstetric Comments  ?Baby passed @ 16 months in 2006  ?  ? ?  ? ? ? ?Home Medications   ? ?Prior to Admission medications   ?Not on File  ? ? ?Family History ?Family History  ?Problem Relation Age of Onset  ? Healthy Mother   ? Healthy Father   ? ? ?Social History ?Social History  ? ?Tobacco Use  ? Smoking status: Former  ?  Packs/day: 2.00  ?  Types: Cigarettes  ? Smokeless tobacco: Never  ?Vaping Use  ? Vaping Use: Never used  ?Substance Use Topics  ? Alcohol use: No  ? Drug use: Not Currently  ? ? ? ?Allergies   ?Sulfa antibiotics ? ? ?Review of Systems ?Review of  Systems  ?Constitutional:  Negative for chills and fever.  ?Gastrointestinal:  Negative for abdominal pain, nausea and vomiting.  ?Genitourinary:  Positive for dysuria and vaginal discharge. Negative for flank pain, frequency, genital sores, hematuria and pelvic pain.  ? ? ?Physical Exam ?Triage Vital Signs ?ED Triage Vitals  ?Enc Vitals Group  ?   BP 09/15/21 0903 110/75  ?   Pulse Rate 09/15/21 0903 91  ?   Resp 09/15/21 0903 18  ?   Temp 09/15/21 0903 98.7 ?F (37.1 ?C)  ?   Temp Source 09/15/21 0903 Oral  ?   SpO2 09/15/21 0903 98 %  ?   Weight --   ?   Height --   ?   Head Circumference --   ?   Peak Flow --   ?   Pain Score 09/15/21 0901 0  ?   Pain Loc --   ?   Pain Edu? --   ?  Excl. in GC? --   ? ?No data found. ? ?Updated Vital Signs ?BP 110/75 (BP Location: Right Arm)   Pulse 91   Temp 98.7 ?F (37.1 ?C) (Oral)   Resp 18   LMP 07/17/2021   SpO2 98%  ? ?Visual Acuity ?Right Eye Distance:   ?Left Eye Distance:   ?Bilateral Distance:   ? ?Right Eye Near:   ?Left Eye Near:    ?Bilateral Near:    ? ?Physical Exam ?Constitutional:   ?   General: She is not in acute distress. ?   Appearance: Normal appearance. She is not ill-appearing.  ?Cardiovascular:  ?   Rate and Rhythm: Normal rate and regular rhythm.  ?Pulmonary:  ?   Effort: Pulmonary effort is normal.  ?   Breath sounds: Normal breath sounds.  ?Abdominal:  ?   General: Abdomen is flat. Bowel sounds are normal.  ?   Palpations: Abdomen is soft.  ?   Tenderness: There is no abdominal tenderness. There is no right CVA tenderness or left CVA tenderness.  ?Neurological:  ?   Mental Status: She is alert.  ? ? ? ?UC Treatments / Results  ?Labs ?(all labs ordered are listed, but only abnormal results are displayed) ?Labs Reviewed  ?POCT URINALYSIS DIPSTICK, ED / UC - Abnormal; Notable for the following components:  ?    Result Value  ? Hgb urine dipstick MODERATE (*)   ? Leukocytes,Ua TRACE (*)   ? All other components within normal limits  ?POC URINE PREG,  ED  ?CERVICOVAGINAL ANCILLARY ONLY  ? ? ?EKG ? ? ?Radiology ?No results found. ? ?Procedures ?Procedures (including critical care time) ? ?Medications Ordered in UC ?Medications - No data to display ? ?Initial Impression / Assessment and Plan / UC Course  ?I have reviewed the triage vital signs and the nursing notes. ? ?Pertinent labs & imaging results that were available during my care of the patient were reviewed by me and considered in my medical decision making (see chart for details). ? ?  ?No evidence of UTI, blood in urine likely related to vaginal spotting from impending menstrual cycle. ? ?Final Clinical Impressions(s) / UC Diagnoses  ? ?Final diagnoses:  ?Vaginal discharge  ?Dysuria  ? ? ? ?Discharge Instructions   ? ?  ?Your urine test today does not show a UTI.  ? ?You will get a call if tests are positive, you will not get a call if tests are negative but you can check results in MyChart if you have a MyChart account.   ? ? ?ED Prescriptions   ?None ?  ? ?PDMP not reviewed this encounter. ?  ?Cathlyn Parsons, NP ?09/15/21 [phone] ? ?

## 2021-09-18 ENCOUNTER — Telehealth (HOSPITAL_COMMUNITY): Payer: Self-pay | Admitting: Emergency Medicine

## 2021-09-18 ENCOUNTER — Telehealth (HOSPITAL_COMMUNITY): Payer: Self-pay

## 2021-09-18 LAB — CERVICOVAGINAL ANCILLARY ONLY
Bacterial Vaginitis (gardnerella): POSITIVE — AB
Candida Glabrata: NEGATIVE
Candida Vaginitis: NEGATIVE
Chlamydia: NEGATIVE
Comment: NEGATIVE
Comment: NEGATIVE
Comment: NEGATIVE
Comment: NEGATIVE
Comment: NEGATIVE
Comment: NORMAL
Neisseria Gonorrhea: NEGATIVE
Trichomonas: NEGATIVE

## 2021-09-18 MED ORDER — METRONIDAZOLE 0.75 % VA GEL: 1.0000 | Freq: Every day | VAGINAL | 0 refills | Status: AC

## 2021-09-18 MED ORDER — METRONIDAZOLE 0.75 % VA GEL: 1.0000 | Freq: Every day | VAGINAL | 0 refills | Status: DC

## 2022-10-24 ENCOUNTER — Encounter (HOSPITAL_COMMUNITY): Payer: Self-pay | Admitting: Endocrinology

## 2022-12-24 ENCOUNTER — Ambulatory Visit: Payer: Medicaid Other | Admitting: Podiatry

## 2023-01-21 ENCOUNTER — Encounter: Payer: Self-pay | Admitting: Podiatry

## 2023-01-21 ENCOUNTER — Ambulatory Visit (INDEPENDENT_AMBULATORY_CARE_PROVIDER_SITE_OTHER): Payer: Medicaid Other | Admitting: Podiatry

## 2023-01-21 ENCOUNTER — Ambulatory Visit (INDEPENDENT_AMBULATORY_CARE_PROVIDER_SITE_OTHER): Payer: Medicaid Other

## 2023-01-21 DIAGNOSIS — M778 Other enthesopathies, not elsewhere classified: Secondary | ICD-10-CM

## 2023-01-21 DIAGNOSIS — D367 Benign neoplasm of other specified sites: Secondary | ICD-10-CM

## 2023-01-21 NOTE — Progress Notes (Signed)
Subjective:   Patient ID: Casey Duncan, female   DOB: 42 y.o.   MRN: [id number]   HPI Patient concerned about having acrylic nails removed and that it is caused some discomfort in her toenails after this was done and she is afraid of losing nails and she has discoloration in her nailbeds her feet in general and has a lesion underneath the right big toe that has been painful recently.  Patient does not currently smoke tries to be active   Review of Systems  All other systems reviewed and are negative.       Objective:  Physical Exam Vitals and nursing note reviewed.  Constitutional:      Appearance: She is well-developed.  Pulmonary:     Effort: Pulmonary effort is normal.  Musculoskeletal:        General: Normal range of motion.  Skin:    General: Skin is warm.  Neurological:     Mental Status: She is alert.     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  At this point I did do a sharp sterile debridement of lesion right and applied a small amount of chemical agent to create immune response with sterile dressing explained what to do if blistering were to occur.  Patient will not have treatment for other conditions as I do not see any way that we can improve them and they are not something that we will give her long-term issues  X-rays indicate that there is moderate depression of the arch mild inflammation no indications of advanced arthritis or  spur formation

## 2023-03-28 ENCOUNTER — Other Ambulatory Visit: Payer: Self-pay | Admitting: Medical Genetics

## 2023-03-28 DIAGNOSIS — Z006 Encounter for examination for normal comparison and control in clinical research program: Secondary | ICD-10-CM

## 2023-07-07 ENCOUNTER — Emergency Department (HOSPITAL_COMMUNITY)
Admission: EM | Admit: 2023-07-07 | Discharge: 2023-07-07 | Disposition: A | Discharge status: Home / Self Care | Payer: Medicaid Other | Attending: Emergency Medicine | Admitting: Emergency Medicine

## 2023-07-07 DIAGNOSIS — A599 Trichomoniasis, unspecified: Secondary | ICD-10-CM | POA: Diagnosis not present

## 2023-07-07 DIAGNOSIS — L292 Pruritus vulvae: Secondary | ICD-10-CM | POA: Diagnosis present

## 2023-07-07 LAB — URINALYSIS, W/ REFLEX TO CULTURE (INFECTION SUSPECTED)
Bilirubin Urine: NEGATIVE
Glucose, UA: NEGATIVE mg/dL
Ketones, ur: NEGATIVE mg/dL
Nitrite: NEGATIVE
Protein, ur: 30 mg/dL — AB
RBC / HPF: >50 RBC/hpf (ref 0–5)
Specific Gravity, Urine: 1.025 (ref 1.005–1.030)
WBC, UA: >50 WBC/hpf (ref 0–5)
pH: 5 (ref 5.0–8.0)

## 2023-07-07 LAB — WET PREP, GENITAL
Clue Cells Wet Prep HPF POC: NONE SEEN
Sperm: NONE SEEN
WBC, Wet Prep HPF POC: >=10 — AB (ref <10)
Yeast Wet Prep HPF POC: NONE SEEN

## 2023-07-07 LAB — PREGNANCY, URINE: Preg Test, Ur: NEGATIVE

## 2023-07-07 MED ORDER — LIDOCAINE HCL (PF) 1 % IJ SOLN
1.0000 mL | Freq: Once | INTRAMUSCULAR | Status: AC
  Administered 2023-07-07: 1.2 mL
  Filled 2023-07-07: qty 5

## 2023-07-07 MED ORDER — DOXYCYCLINE HYCLATE 100 MG PO TABS: 100.0000 mg | ORAL_TABLET | Freq: Two times a day (BID) | ORAL | 0 refills | Status: DC

## 2023-07-07 MED ORDER — CEFTRIAXONE SODIUM 500 MG IJ SOLR
500.0000 mg | Freq: Once | INTRAMUSCULAR | Status: AC
  Administered 2023-07-07: 500 mg via INTRAMUSCULAR
  Filled 2023-07-07: qty 500

## 2023-07-07 MED ORDER — METRONIDAZOLE 500 MG PO TABS
2000.0000 mg | ORAL_TABLET | Freq: Once | ORAL | Status: AC
  Administered 2023-07-07: 2000 mg via ORAL
  Filled 2023-07-07: qty 4

## 2023-07-07 NOTE — ED Provider Notes (Signed)
  EMERGENCY DEPARTMENT AT [facility] Provider Note  CSN: [id number] Arrival date & time: 07/07/23 0240  Chief Complaint(s) No chief complaint on file.  HPI Casey Duncan is a 43 y.o. female    The history is provided by the patient.  Vaginal Itching This is a new problem. The current episode started 2 days ago. The problem occurs constantly. The problem has not changed since onset.Pertinent negatives include no chest pain, no abdominal pain, no headaches and no shortness of breath. Nothing aggravates the symptoms. Nothing relieves the symptoms. She has tried nothing for the symptoms.   Has unprotected relations with her long-time partner who may not be faithful. No discharge. No urinary symptoms.  Past Medical History Past Medical History:  Diagnosis Date   Abnormal vaginal bleeding    Anemia    Gestational diabetes    Patient Active Problem List   Diagnosis Date Noted   SVD (12/28) 05/27/2019   GDM, class A1 05/27/2019   Normal labor and delivery 05/25/2019   Home Medication(s) Prior to Admission medications   Medication Sig Start Date End Date Taking? Authorizing Provider  doxycycline  (VIBRA -TABS) 100 MG tablet Take 1 tablet (100 mg total) by mouth 2 (two) times daily. 07/07/23  Yes Aracelys Glade, Raynell Moder, MD                                                                                                                                    Allergies Sulfa  antibiotics  Review of Systems Review of Systems  Respiratory:  Negative for shortness of breath.   Cardiovascular:  Negative for chest pain.  Gastrointestinal:  Negative for abdominal pain.  Neurological:  Negative for headaches.   As noted in HPI  Physical Exam Vital Signs  I have reviewed the triage vital signs BP 116/75   Pulse (!) 111   Temp 98.2 F (36.8 C)   Resp 18   Ht 5' 9 (1.753 m)   Wt 113.4 kg   SpO2 100%   BMI 36.92 kg/m   Physical Exam Vitals reviewed. Exam conducted  with a chaperone present.  Constitutional:      General: She is not in acute distress.    Appearance: She is well-developed. She is not diaphoretic.  HENT:     Head: Normocephalic and atraumatic.     Right Ear: External ear normal.     Left Ear: External ear normal.     Nose: Nose normal.  Eyes:     General: No scleral icterus.    Conjunctiva/sclera: Conjunctivae normal.  Neck:     Trachea: Phonation normal.  Cardiovascular:     Rate and Rhythm: Normal rate and regular rhythm.  Pulmonary:     Effort: Pulmonary effort is normal. No respiratory distress.     Breath sounds: No stridor.  Abdominal:     General: There is no distension.  Genitourinary:    Labia:  Right: No rash, tenderness or lesion.        Left: No rash, tenderness or lesion.      Vagina: Vaginal discharge (mild) and erythema present.     Cervix: Friability, lesion and erythema present. No cervical motion tenderness.     Uterus: Not tender.      Adnexa:        Right: No tenderness.         Left: No tenderness.    Musculoskeletal:        General: Normal range of motion.     Cervical back: Normal range of motion.  Lymphadenopathy:     Lower Body: No right inguinal adenopathy. No left inguinal adenopathy.  Neurological:     Mental Status: She is alert and oriented to person, place, and time.  Psychiatric:        Behavior: Behavior normal.     ED Results and Treatments Labs (all labs ordered are listed, but only abnormal results are displayed) Labs Reviewed  WET PREP, GENITAL - Abnormal; Notable for the following components:      Result Value   Trich, Wet Prep PRESENT (*)    WBC, Wet Prep HPF POC >=10 (*)    All other components within normal limits  URINALYSIS, W/ REFLEX TO CULTURE (INFECTION SUSPECTED) - Abnormal; Notable for the following components:   APPearance HAZY (*)    Hgb urine dipstick MODERATE (*)    Protein, ur 30 (*)    Leukocytes,Ua LARGE (*)    Bacteria, UA FEW (*)    All other  components within normal limits  URINE CULTURE  PREGNANCY, URINE  GC/CHLAMYDIA PROBE AMP ([place]) NOT AT [facility]                                                                                                                         EKG  EKG Interpretation Date/Time:    Ventricular Rate:    PR Interval:    QRS Duration:    QT Interval:    QTC Calculation:   R Axis:      Text Interpretation:         Radiology No results found.  Medications Ordered in ED Medications  cefTRIAXone  (ROCEPHIN ) injection 500 mg (has no administration in time range)  lidocaine  (PF) (XYLOCAINE ) 1 % injection 1-2.1 mL (has no administration in time range)  metroNIDAZOLE  (FLAGYL ) tablet 2,000 mg (2,000 mg Oral Given 07/07/23 0509)   Procedures Procedures  (including critical care time) Medical Decision Making / ED Course   Medical Decision Making Amount and/or Complexity of Data Reviewed Labs: ordered. Decision-making details documented in ED Course.  Risk Prescription drug management.    Exam suspicious for cervicitis. No PID. Work up consistent with [employer]. Will treat with flagyl  and treat empirically for GC/Chlam. UA findings likely from Trich infection.  UPT to guide care negative.     Final Clinical Impression(s) / ED Diagnoses Final diagnoses:  Trichomonal infection   The  patient appears reasonably screened and/or stabilized for discharge and I doubt any other medical condition or other [facility] requiring further screening, evaluation, or treatment in the ED at this time. I have discussed the findings, Dx and Tx plan with the patient/family who expressed understanding and agree(s) with the plan. Discharge instructions discussed at length. The patient/family was given strict return precautions who verbalized understanding of the instructions. No further questions at time of discharge.  Disposition: Discharge  Condition: Good  ED Discharge Orders          Ordered    doxycycline   (VIBRA -TABS) 100 MG tablet  2 times daily        07/07/23 0502             Follow Up: Primary care provider  Call  to schedule an appointment for close follow up     This chart was dictated using voice recognition software.  Despite best efforts to proofread,  errors can occur which can change the documentation meaning.    Trine Raynell Moder, MD 07/07/23 [phone]

## 2023-07-07 NOTE — ED Triage Notes (Signed)
 The pt has had itching inside her vagina since Thursday.   She was awakened from sleep with the irritation

## 2023-07-08 LAB — URINE CULTURE: Culture: <10000 — AB

## 2023-07-08 LAB — GC/CHLAMYDIA PROBE AMP (~~LOC~~) NOT AT ARMC
Chlamydia: NEGATIVE
Comment: NEGATIVE
Comment: NORMAL
Neisseria Gonorrhea: NEGATIVE

## 2024-02-10 ENCOUNTER — Other Ambulatory Visit (HOSPITAL_COMMUNITY): Payer: Self-pay

## 2024-02-10 ENCOUNTER — Encounter (HOSPITAL_COMMUNITY): Payer: Self-pay

## 2024-02-10 ENCOUNTER — Ambulatory Visit (HOSPITAL_COMMUNITY)
Admission: EM | Admit: 2024-02-10 | Discharge: 2024-02-10 | Disposition: A | Discharge status: Home / Self Care | Attending: Emergency Medicine | Admitting: Emergency Medicine

## 2024-02-10 DIAGNOSIS — J069 Acute upper respiratory infection, unspecified: Secondary | ICD-10-CM | POA: Diagnosis not present

## 2024-02-10 LAB — POC COVID19/FLU A&B COMBO
Covid Antigen, POC: NEGATIVE
Influenza A Antigen, POC: NEGATIVE
Influenza B Antigen, POC: NEGATIVE

## 2024-02-10 MED ORDER — BENZONATATE 100 MG PO CAPS
100.0000 mg | ORAL_CAPSULE | Freq: Three times a day (TID) | ORAL | 0 refills | Status: DC | PRN
  Filled 2024-02-10: qty 30, 10d supply, fill #0

## 2024-02-10 NOTE — ED Triage Notes (Signed)
 Patient here today with c/o cough, nasal congestion, chills, and body aches X 2 days. Patient's children are also sick with the same symptoms. Patient is not taking anything for symptoms.

## 2024-02-10 NOTE — ED Triage Notes (Signed)
 Mother left building and minor children were waiting in lobby.Unble to triage monor Children until Mother arrives. Mother has now returned and wants to be seen . Mat informed Family

## 2024-02-10 NOTE — Discharge Instructions (Addendum)
 The tessalon  cough pills can be taken 3 times daily, 1-2 pills at a time.  Please [facility] guaifenesin ([facility]x) [facility] (30 tablets for $1.12). take with LOTS of water   Continue supportive care It can take 5-6 days for symptom improvement

## 2024-02-10 NOTE — ED Provider Notes (Signed)
 [facility]    CSN: [id number] Arrival date & time: 02/10/24  0818     History   Chief Complaint Chief Complaint  Patient presents with   Cough    HPI Casey Duncan is a 43 y.o. female.  Here with 2-day history of nasal congestion, cough, chills and bodyaches No fever.  No abdominal pain, NVD, rash Has not attempted any interventions yet  Both children are sick with similar symptoms No recent travel   Past Medical History:  Diagnosis Date   Abnormal vaginal bleeding    Anemia    Gestational diabetes     Patient Active Problem List   Diagnosis Date Noted   SVD (12/28) 05/27/2019   GDM, class A1 05/27/2019   Normal labor and delivery 05/25/2019    Past Surgical History:  Procedure Laterality Date   BREAST SURGERY     lipo 360     vaginal rejuvenation      OB History     Gravida  4   Para  4   Term  4   Preterm      AB  0   Living  3      SAB      IAB      Ectopic      Multiple  0   Live Births  4        Obstetric Comments  Baby passed @ 16 months in 2006          Home Medications    Prior to Admission medications   Medication Sig Start Date End Date Taking? Authorizing Provider  benzonatate  (TESSALON ) 100 MG capsule Take 1 capsule (100 mg total) by mouth 3 (three) times daily as needed for cough. 02/10/24  Yes Safa Derner, Asberry, PA-C    Family History Family History  Problem Relation Age of Onset   Healthy Mother    Healthy Father     Social History Social History   Tobacco Use   Smoking status: Every Day    Current packs/day: 2.00    Types: Cigarettes   Smokeless tobacco: Never  Vaping Use   Vaping status: Never Used  Substance Use Topics   Alcohol use: No   Drug use: Not Currently     Allergies   Sulfa  antibiotics   Review of Systems Review of Systems Per HPI  Physical Exam Triage Vital Signs ED Triage Vitals  Encounter Vitals Group     BP 02/10/24 0952 104/79     Girls Systolic BP  Percentile --      Girls Diastolic BP Percentile --      Boys Systolic BP Percentile --      Boys Diastolic BP Percentile --      Pulse Rate 02/10/24 0952 84     Resp 02/10/24 0952 16     Temp 02/10/24 0952 98.1 F (36.7 C)     Temp Source 02/10/24 0952 Oral     SpO2 02/10/24 0952 97 %     Weight --      Height --      Head Circumference --      Peak Flow --      Pain Score 02/10/24 0954 0     Pain Loc --      Pain Education --      Exclude from Growth Chart --    No data found.  Updated Vital Signs BP 104/79 (BP Location: Left Arm)   Pulse 84  Temp 98.1 F (36.7 C) (Oral)   Resp 16   LMP  (LMP Unknown)   SpO2 97%   Breastfeeding No    Physical Exam Vitals and nursing note reviewed.  Constitutional:      Appearance: She is not ill-appearing.  HENT:     Right Ear: Tympanic membrane and ear canal normal.     Left Ear: Tympanic membrane and ear canal normal.     Nose: Congestion present. No rhinorrhea.     Mouth/Throat:     Mouth: Mucous membranes are moist.     Pharynx: Oropharynx is clear. No posterior oropharyngeal erythema.  Eyes:     Conjunctiva/sclera: Conjunctivae normal.  Cardiovascular:     Rate and Rhythm: Normal rate and regular rhythm.     Pulses: Normal pulses.     Heart sounds: Normal heart sounds.  Pulmonary:     Effort: Pulmonary effort is normal.     Breath sounds: Normal breath sounds.  Abdominal:     Palpations: Abdomen is soft.     Tenderness: There is no abdominal tenderness.  Musculoskeletal:     Cervical back: Normal range of motion. No rigidity.  Lymphadenopathy:     Cervical: No cervical adenopathy.  Skin:    General: Skin is warm and dry.  Neurological:     Mental Status: She is alert and oriented to person, place, and time.     UC Treatments / Results  Labs (all labs ordered are listed, but only abnormal results are displayed) Labs Reviewed  POC COVID19/FLU A&B COMBO    EKG  Radiology No results  found.  Procedures Procedures (including critical care time)  Medications Ordered in UC Medications - No data to display  Initial Impression / Assessment and Plan / UC Course  I have reviewed the triage vital signs and the nursing notes.  Pertinent labs & imaging results that were available during my care of the patient were reviewed by me and considered in my medical decision making (see chart for details).  Afebrile, well-appearing, clear lungs Rapid COVID and flu negative Discussed other viral etiology Supportive care and OTC options discussed.  Sent Tessalon  pearls for cough.  Patient is agreeable with plan, all questions answered  Final Clinical Impressions(s) / UC Diagnoses   Final diagnoses:  Viral URI with cough     Discharge Instructions      The tessalon  cough pills can be taken 3 times daily, 1-2 pills at a time.  Please [facility]enesin [facility] (30 tablets for $1.12). take with LOTS of water   Continue supportive care It can take 5-6 days for symptom improvement      ED Prescriptions     Medication Sig Dispense Auth. Provider   benzonatate  (TESSALON ) 100 MG capsule Take 1 capsule (100 mg total) by mouth 3 (three) times daily as needed for cough. 30 capsule Nivaan Dicenzo, Asberry, PA-C      PDMP not reviewed this encounter.   Jeryl Asberry, DEVONNA 02/10/24 1112

## 2024-02-16 ENCOUNTER — Emergency Department (HOSPITAL_COMMUNITY)
Admission: EM | Admit: 2024-02-16 | Discharge: 2024-02-16 | Discharge status: Against Medical Advice | Attending: Emergency Medicine | Admitting: Emergency Medicine

## 2024-02-16 DIAGNOSIS — R200 Anesthesia of skin: Secondary | ICD-10-CM | POA: Insufficient documentation

## 2024-02-16 DIAGNOSIS — Z5321 Procedure and treatment not carried out due to patient leaving prior to being seen by health care provider: Secondary | ICD-10-CM | POA: Insufficient documentation

## 2024-02-17 ENCOUNTER — Other Ambulatory Visit: Payer: Self-pay

## 2024-02-17 ENCOUNTER — Emergency Department (HOSPITAL_COMMUNITY)
Admission: EM | Admit: 2024-02-17 | Discharge: 2024-02-17 | Disposition: A | Discharge status: Home / Self Care | Attending: Emergency Medicine | Admitting: Emergency Medicine

## 2024-02-17 ENCOUNTER — Encounter (HOSPITAL_COMMUNITY): Payer: Self-pay | Admitting: *Deleted

## 2024-02-17 DIAGNOSIS — Z202 Contact with and (suspected) exposure to infections with a predominantly sexual mode of transmission: Secondary | ICD-10-CM | POA: Insufficient documentation

## 2024-02-17 DIAGNOSIS — L292 Pruritus vulvae: Secondary | ICD-10-CM | POA: Insufficient documentation

## 2024-02-17 DIAGNOSIS — E119 Type 2 diabetes mellitus without complications: Secondary | ICD-10-CM | POA: Insufficient documentation

## 2024-02-17 DIAGNOSIS — Z711 Person with feared health complaint in whom no diagnosis is made: Secondary | ICD-10-CM

## 2024-02-17 LAB — GC/CHLAMYDIA PROBE AMP (~~LOC~~) NOT AT ARMC
Chlamydia: NEGATIVE
Comment: NEGATIVE
Comment: NORMAL
Neisseria Gonorrhea: NEGATIVE

## 2024-02-17 LAB — WET PREP, GENITAL
Clue Cells Wet Prep HPF POC: NONE SEEN
Sperm: NONE SEEN
Trich, Wet Prep: NONE SEEN
WBC, Wet Prep HPF POC: <10 (ref <10)
Yeast Wet Prep HPF POC: NONE SEEN

## 2024-02-17 LAB — URINALYSIS, ROUTINE W REFLEX MICROSCOPIC
Bilirubin Urine: NEGATIVE
Glucose, UA: NEGATIVE mg/dL
Ketones, ur: NEGATIVE mg/dL
Leukocytes,Ua: NEGATIVE
Nitrite: NEGATIVE
Protein, ur: NEGATIVE mg/dL
Specific Gravity, Urine: 1.027 (ref 1.005–1.030)
pH: 5 (ref 5.0–8.0)

## 2024-02-17 LAB — CBG MONITORING, ED: Glucose-Capillary: 127 mg/dL — ABNORMAL HIGH (ref 70–99)

## 2024-02-17 LAB — POC URINE PREG, ED: Preg Test, Ur: NEGATIVE

## 2024-02-17 NOTE — Discharge Instructions (Addendum)
 You will be notified if you test positive for an STD and require treatment.  You may also review these results on MyChart.  Follow-up with a primary care doctor as needed for ongoing symptoms and complaints.

## 2024-02-17 NOTE — ED Triage Notes (Signed)
 Pt would like to be tested for STD as she has had vaginal itching for several days.  She had unprotected sex Thursday.  Pt also states that her great toes have been numb for several weeks.

## 2024-02-17 NOTE — ED Notes (Addendum)
 Pt came up to desk and asked about DC paperwork, refuses to go back into room for last set of vitals but reports I'm tired and want to leave, I have been waiting over 3 hours. Pt was given DC paperwork and left

## 2024-02-17 NOTE — ED Provider Notes (Signed)
 [place] EMERGENCY DEPARTMENT AT [facility] Provider Note   CSN: [id number] Arrival date & time: 02/17/24  0037     Patient presents with: Vaginal Itching   Casey Duncan is a 43 y.o. female.   43 year old female presents to the emergency department requesting testing for STDs.  She states that she has been experiencing some vaginal itching and irritation over the past 3 days since having unprotected sex with a female partner.  She has been sexually active with this partner for the past 6 years.  Does have a history of trichomonas in February which was treated.  Denies any vaginal discharge, dysuria, hematuria, fevers.  She has not tried any medications for symptom control.  Does report that she began use of a new body wash recently.  The history is provided by the patient. No language interpreter was used.  Vaginal Itching       Prior to Admission medications   Medication Sig Start Date End Date Taking? Authorizing Provider  benzonatate  (TESSALON ) 100 MG capsule Take 1 capsule (100 mg total) by mouth 3 (three) times daily as needed for cough. 02/10/24   Rising, Asberry, PA-C    Allergies: Sulfa  antibiotics    Review of Systems Ten systems reviewed and are negative for acute change, except as noted in the HPI.    Updated Vital Signs BP 106/80 (BP Location: Right Arm)   Pulse 94   Temp 97.7 F (36.5 C)   Resp 16   LMP  (LMP Unknown)   SpO2 100%   Physical Exam Vitals and nursing note reviewed.  Constitutional:      General: She is not in acute distress.    Appearance: She is well-developed. She is not diaphoretic.     Comments: Nontoxic appearing and in NAD  HENT:     Head: Normocephalic and atraumatic.  Eyes:     General: No scleral icterus.    Conjunctiva/sclera: Conjunctivae normal.  Pulmonary:     Effort: Pulmonary effort is normal. No respiratory distress.     Comments: Respirations even and unlabored Musculoskeletal:        General: Normal  range of motion.     Cervical back: Normal range of motion.  Skin:    General: Skin is warm and dry.     Coloration: Skin is not pale.     Findings: No erythema or rash.  Neurological:     Mental Status: She is alert and oriented to person, place, and time.  Psychiatric:        Behavior: Behavior normal.     (all labs ordered are listed, but only abnormal results are displayed) Labs Reviewed  URINALYSIS, ROUTINE W REFLEX MICROSCOPIC - Abnormal; Notable for the following components:      Result Value   APPearance HAZY (*)    Hgb urine dipstick MODERATE (*)    Bacteria, UA RARE (*)    All other components within normal limits  CBG MONITORING, ED - Abnormal; Notable for the following components:   Glucose-Capillary 127 (*)    All other components within normal limits  WET PREP, GENITAL  POC URINE PREG, ED  GC/CHLAMYDIA PROBE AMP ([place]) NOT AT [facility]    EKG: None  Radiology: No results found.   Procedures   Medications Ordered in the ED - No data to display  Medical Decision Making Amount and/or Complexity of Data Reviewed Labs: ordered.   This patient presents to the ED for concern of vaginal itching/irritation, this involves an extensive number of treatment options, and is a complaint that carries with it a high risk of complications and morbidity.  The differential diagnosis includes vaginitis vs contact dermatitis vs BV vs STI vs vulvodynia   Co morbidities that complicate the patient evaluation  DM Anemia   Additional history obtained:  External records from outside source obtained and reviewed including wet prep in February positive for trichomonas.   Lab Tests:  I Ordered, and personally interpreted labs.  The pertinent results include:  Normal wet prep. UA without bacteriuria or pyuria.   Cardiac Monitoring:  The patient was maintained on a cardiac monitor.  I personally viewed and interpreted the cardiac  monitored which showed an underlying rhythm of: NSR   Medicines ordered and prescription drug management:  I have reviewed the patients home medicines and have made adjustments as needed   Test Considered:  US  pelvis   Problem List / ED Course:  Concern for STDs.  Gonorrhea and Chlamydia testing is pending.  Have informed patient that these tests should result in approximately 48 hours. Urinalysis today is reassuring.  No pyuria noted.  Wet prep specifically negative for yeast, clue cells, trichomonas.  No white blood cell seen on wet prep either.  Low suspicion for STI.  Question whether symptoms may be related to use of a new body wash. As an aside, patient reports some numbness to the medial aspect of her bilateral great toes.  Symptoms wax and wane.  She is neurovascularly intact with warm, well-perfused extremities.  She does have a history of diabetes.  Have explained to the patient that symptoms are likely related to a degree of neuropathy.  No further emergent workup indicated.  Stable to follow-up with PCP.   Reevaluation:  After the interventions noted above, I reevaluated the patient and found that they have :stayed the same   Dispostion:  After consideration of the diagnostic results and the patients response to treatment, I feel that the patent would benefit from PCP f/u for evaluation of ongoing symptoms. Return precautions discussed and provided. Patient discharged in stable condition with no unaddressed concerns.       Final diagnoses:  Concern about STD in female without diagnosis    ED Discharge Orders     None          Keith Sor, PA-C 02/17/24 0404    Carita Senior, MD 02/17/24 [phone]

## 2024-02-19 ENCOUNTER — Other Ambulatory Visit (HOSPITAL_COMMUNITY): Payer: Self-pay

## 2024-03-04 ENCOUNTER — Ambulatory Visit (HOSPITAL_COMMUNITY)
Admission: EM | Admit: 2024-03-04 | Discharge: 2024-03-04 | Disposition: A | Discharge status: Home / Self Care | Attending: Internal Medicine | Admitting: Internal Medicine

## 2024-03-04 ENCOUNTER — Encounter (HOSPITAL_COMMUNITY): Payer: Self-pay

## 2024-03-04 DIAGNOSIS — M545 Low back pain, unspecified: Secondary | ICD-10-CM | POA: Diagnosis present

## 2024-03-04 DIAGNOSIS — R3129 Other microscopic hematuria: Secondary | ICD-10-CM

## 2024-03-04 DIAGNOSIS — R1024 Suprapubic pain: Secondary | ICD-10-CM | POA: Diagnosis not present

## 2024-03-04 DIAGNOSIS — Z113 Encounter for screening for infections with a predominantly sexual mode of transmission: Secondary | ICD-10-CM

## 2024-03-04 DIAGNOSIS — L02214 Cutaneous abscess of groin: Secondary | ICD-10-CM | POA: Diagnosis present

## 2024-03-04 LAB — POCT URINALYSIS DIP (MANUAL ENTRY)
Bilirubin, UA: NEGATIVE
Glucose, UA: NEGATIVE mg/dL
Ketones, POC UA: NEGATIVE mg/dL
Leukocytes, UA: NEGATIVE
Nitrite, UA: NEGATIVE
Protein Ur, POC: NEGATIVE mg/dL
Spec Grav, UA: >=1.03 — AB (ref 1.010–1.025)
Urobilinogen, UA: 0.2 U/dL
pH, UA: 5.5 (ref 5.0–8.0)

## 2024-03-04 MED ORDER — DOXYCYCLINE HYCLATE 100 MG PO CAPS: 100.0000 mg | ORAL_CAPSULE | Freq: Two times a day (BID) | ORAL | 0 refills | Status: AC

## 2024-03-04 NOTE — ED Provider Notes (Signed)
 [facility]    CSN: [id number] Arrival date & time: 03/04/24  8084      History   Chief Complaint Chief Complaint  Patient presents with   Back Pain   Abdominal Pain    HPI Casey Duncan is a 42 y.o. female who presents with 2 problems 1- R lower back pain that radiates to her buttocks since last night and at times feels it in central mid lumbar region. She is worried she may have STD since she read on the internet that GC/Chlamydia can cause back pain. 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  2- lower abdominal pain since being here. Denies Dysuria. Has mild itching on vaginal region.       She has a new sexual partner  x 1 week and the condom broke. She denies abnormal vaginal  discharge. She wants all STD tests done. Had it done a couple of months ago, and was negative.   3- Has an abscess on L side of labia majora x 2 days, and is where she had one on her L thigh and touches this area. The thigh abscess has resolved. Has small little bumps on labia majora which she has had for 2-3 years.    Past Medical History:  Diagnosis Date   Abnormal vaginal bleeding    Anemia    Gestational diabetes     Patient Active Problem List   Diagnosis Date Noted   SVD (12/28) 05/27/2019   GDM, class A1 05/27/2019   Normal labor and delivery 05/25/2019    Past Surgical History:  Procedure Laterality Date   BREAST SURGERY     lipo 360     vaginal rejuvenation      OB History     Gravida  4   Para  4   Term  4   Preterm      AB  0   Living  3      SAB      IAB      Ectopic      Multiple  0   Live Births  4        Obstetric Comments  Baby passed @ 16 months in 2006          Home Medications    Prior to Admission medications   Medication Sig Start Date End Date Taking? Authorizing Provider  doxycycline  (VIBRAMYCIN ) 100 MG capsule Take 1 capsule (100 mg total) by  mouth 2 (two) times daily. 03/04/24  Yes Rodriguez-Southworth, Kyra, PA-C    Family History Family History  Problem Relation Age of Onset   Healthy Mother    Healthy Father     Social History Social History   Tobacco Use   Smoking status: Every Day    Current packs/day: 2.00    Types: Cigarettes   Smokeless tobacco: Never  Vaping Use   Vaping status: Never Used  Substance Use Topics   Alcohol use: No   Drug use: Not Currently     Allergies   Sulfa  antibiotics   Review of Systems Review of Systems As noted in HPI  Physical Exam Triage Vital Signs ED Triage Vitals [03/04/24 1929]  Encounter Vitals Group     BP 97/74     Girls Systolic BP Percentile      Girls Diastolic BP Percentile      Boys Systolic BP  Percentile      Boys Diastolic BP Percentile      Pulse Rate 91     Resp 16     Temp (!) 97.5 F (36.4 C)     Temp Source Oral     SpO2 96 %     Weight      Height      Head Circumference      Peak Flow      Pain Score 7     Pain Loc      Pain Education      Exclude from Growth Chart    No data found.  Updated Vital Signs BP 97/74 (BP Location: Right Arm)   Pulse 91   Temp (!) 97.5 F (36.4 C) (Oral)   Resp 16   LMP  (LMP Unknown)   SpO2 96%   Visual Acuity Right Eye Distance:   Left Eye Distance:   Bilateral Distance:    Right Eye Near:   Left Eye Near:    Bilateral Near:     Physical Exam Vitals and nursing note reviewed.  Constitutional:      General: She is not in acute distress.    Appearance: She is obese. She is not toxic-appearing.  HENT:     Right Ear: External ear normal.     Left Ear: External ear normal.  Eyes:     General: No scleral icterus.    Conjunctiva/sclera: Conjunctivae normal.  Pulmonary:     Effort: Pulmonary effort is normal.  Abdominal:     General: Bowel sounds are normal.     Palpations: Abdomen is soft. There is no mass.     Tenderness: There is no abdominal tenderness. There is no guarding or  rebound.  Musculoskeletal:        General: Normal range of motion.  Skin:    General: Skin is warm and dry.     Comments: Has 1/2 cm x 1/2 cm raised area on L mid labia majora. Has prominent skin follicles on R external labia majora.   Neurological:     Mental Status: She is alert and oriented to person, place, and time.     Gait: Gait normal.  Psychiatric:        Mood and Affect: Mood normal.        Behavior: Behavior normal.        Thought Content: Thought content normal.        Judgment: Judgment normal.      UC Treatments / Results  Labs (all labs ordered are listed, but only abnormal results are displayed) Labs Reviewed  POCT URINALYSIS DIP (MANUAL ENTRY) - Abnormal; Notable for the following components:      Result Value   Spec Grav, UA >=1.030 (*)    Blood, UA moderate (*)    All other components within normal limits  URINE CULTURE  HIV ANTIBODY (ROUTINE TESTING W REFLEX)  RPR  CERVICOVAGINAL ANCILLARY ONLY    EKG   Radiology No results found.  Procedures Procedures (including critical care time)  Medications Ordered in UC Medications - No data to display  Initial Impression / Assessment and Plan / UC Course  I have reviewed the triage vital signs and the nursing notes.  Pertinent labs  results that were available during my care of the patient were reviewed by me and considered in my medical decision making (see chart for details).  L labia abscess- Placed on Doxy as noted.  Screen STD Suprapubic  pain- UA shows mod microscopic hematuria and rest is neg.  Muscular back pain- may take Ibuprofen  as noted in instructions STD testing send out.  Urine culture ordered.  We will inform her of positive results.    Final Clinical Impressions(s) / UC Diagnoses   Final diagnoses:  Screen for STD (sexually transmitted disease)  Suprapubic pain, acute  Low back pain at multiple sites  Microscopic hematuria  Cutaneous abscess of groin     Discharge  Instructions      You may take Ibuprofen  up to 800 mg every 8 hours for pain You may alternate heat and ice on areas of pain 3-4 times a day We will inform you if your tests come backs positive   Your urine does not show signs of infection. There is a little bit of microscopic blood, so I am sending the urine for a culture to check if any bacterial grows.      ED Prescriptions     Medication Sig Dispense Auth. Provider   doxycycline  (VIBRAMYCIN ) 100 MG capsule Take 1 capsule (100 mg total) by mouth 2 (two) times daily. 14 capsule Rodriguez-Southworth, Kelin Borum, PA-C      PDMP not reviewed this encounter.   Lindi Carter, PA-C 03/04/24 2018

## 2024-03-04 NOTE — ED Triage Notes (Signed)
 Patient reports that she has a new sexual partner and the condom broke.Patient c/o right lower back that radiates into the right buttocks, Patient also c/o lower abdominal pain, and states she has an abscess  to the left side of her vagina and nodules on the vagina itself.  Patient denies vaginal discharge.

## 2024-03-04 NOTE — Discharge Instructions (Addendum)
 You may take Ibuprofen  up to 800 mg every 8 hours for pain You may alternate heat and ice on areas of pain 3-4 times a day We will inform you if your tests come backs positive   Your urine does not show signs of infection. There is a little bit of microscopic blood, so I am sending the urine for a culture to check if any bacterial grows.

## 2024-03-05 LAB — CERVICOVAGINAL ANCILLARY ONLY
Bacterial Vaginitis (gardnerella): NEGATIVE
Candida Glabrata: NEGATIVE
Candida Vaginitis: NEGATIVE
Chlamydia: NEGATIVE
Comment: NEGATIVE
Comment: NEGATIVE
Comment: NEGATIVE
Comment: NEGATIVE
Comment: NEGATIVE
Comment: NORMAL
Neisseria Gonorrhea: NEGATIVE
Trichomonas: NEGATIVE

## 2024-03-05 LAB — RPR: RPR Ser Ql: NONREACTIVE

## 2024-03-05 LAB — URINE CULTURE
Culture: NO GROWTH
Special Requests: NORMAL

## 2024-03-06 ENCOUNTER — Ambulatory Visit (HOSPITAL_COMMUNITY): Payer: Self-pay

## 2024-03-06 LAB — MISC LABCORP TEST (SEND OUT): Labcorp test code: 83935

## 2024-03-10 ENCOUNTER — Other Ambulatory Visit: Payer: Self-pay | Admitting: Medical Genetics

## 2024-03-10 DIAGNOSIS — Z006 Encounter for examination for normal comparison and control in clinical research program: Secondary | ICD-10-CM
